# Patient Record
Sex: Female | Born: 1960
Health system: Southern US, Community
[De-identification: ages and names within clinical notes are randomized; demographics above are authoritative.]

## PROBLEM LIST (undated history)

## (undated) DIAGNOSIS — E079 Disorder of thyroid, unspecified: Secondary | ICD-10-CM

## (undated) DIAGNOSIS — E785 Hyperlipidemia, unspecified: Secondary | ICD-10-CM

## (undated) DIAGNOSIS — G43909 Migraine, unspecified, not intractable, without status migrainosus: Secondary | ICD-10-CM

## (undated) DIAGNOSIS — K219 Gastro-esophageal reflux disease without esophagitis: Secondary | ICD-10-CM

## (undated) DIAGNOSIS — T7840XA Allergy, unspecified, initial encounter: Secondary | ICD-10-CM

## (undated) HISTORY — DX: Disorder of thyroid, unspecified: E07.9

## (undated) HISTORY — DX: Hyperlipidemia, unspecified: E78.5

## (undated) HISTORY — PX: CHOLECYSTECTOMY: SHX55

## (undated) HISTORY — DX: Gastro-esophageal reflux disease without esophagitis: K21.9

## (undated) HISTORY — DX: Allergy, unspecified, initial encounter: T78.40XA

## (undated) HISTORY — DX: Migraine, unspecified, not intractable, without status migrainosus: G43.909

---

## 1993-09-21 HISTORY — PX: UTERINE FIBROID SURGERY: SHX826

## 1999-04-06 ENCOUNTER — Emergency Department (HOSPITAL_COMMUNITY): Admission: EM | Admit: 1999-04-06 | Discharge: 1999-04-06 | Payer: Self-pay | Admitting: Emergency Medicine

## 1999-04-14 ENCOUNTER — Other Ambulatory Visit: Admission: RE | Admit: 1999-04-14 | Discharge: 1999-04-14 | Payer: Self-pay | Admitting: Gynecology

## 1999-05-13 ENCOUNTER — Ambulatory Visit (HOSPITAL_COMMUNITY): Admission: RE | Admit: 1999-05-13 | Discharge: 1999-05-14 | Payer: Self-pay | Admitting: General Surgery

## 1999-05-13 ENCOUNTER — Encounter: Payer: Self-pay | Admitting: General Surgery

## 1999-12-18 ENCOUNTER — Encounter: Payer: Self-pay | Admitting: Family Medicine

## 1999-12-18 ENCOUNTER — Encounter: Admission: RE | Admit: 1999-12-18 | Discharge: 1999-12-18 | Payer: Self-pay | Admitting: Family Medicine

## 1999-12-23 ENCOUNTER — Encounter: Payer: Self-pay | Admitting: Family Medicine

## 1999-12-23 ENCOUNTER — Encounter: Admission: RE | Admit: 1999-12-23 | Discharge: 1999-12-23 | Payer: Self-pay | Admitting: Family Medicine

## 2000-06-16 ENCOUNTER — Encounter: Payer: Self-pay | Admitting: Gynecology

## 2000-06-16 ENCOUNTER — Encounter: Admission: RE | Admit: 2000-06-16 | Discharge: 2000-06-16 | Payer: Self-pay | Admitting: Gynecology

## 2000-10-12 ENCOUNTER — Other Ambulatory Visit: Admission: RE | Admit: 2000-10-12 | Discharge: 2000-10-12 | Payer: Self-pay | Admitting: Gynecology

## 2001-07-11 ENCOUNTER — Encounter: Admission: RE | Admit: 2001-07-11 | Discharge: 2001-07-11 | Payer: Self-pay | Admitting: Gynecology

## 2001-07-11 ENCOUNTER — Encounter: Payer: Self-pay | Admitting: Gynecology

## 2001-09-20 ENCOUNTER — Other Ambulatory Visit: Admission: RE | Admit: 2001-09-20 | Discharge: 2001-09-20 | Payer: Self-pay | Admitting: Gynecology

## 2001-11-02 ENCOUNTER — Other Ambulatory Visit: Admission: RE | Admit: 2001-11-02 | Discharge: 2001-11-02 | Payer: Self-pay | Admitting: Gynecology

## 2003-03-07 ENCOUNTER — Other Ambulatory Visit: Admission: RE | Admit: 2003-03-07 | Discharge: 2003-03-07 | Payer: Self-pay | Admitting: Gynecology

## 2004-05-22 ENCOUNTER — Other Ambulatory Visit: Admission: RE | Admit: 2004-05-22 | Discharge: 2004-05-22 | Payer: Self-pay | Admitting: Gynecology

## 2004-06-04 ENCOUNTER — Encounter: Admission: RE | Admit: 2004-06-04 | Discharge: 2004-06-04 | Payer: Self-pay | Admitting: Gynecology

## 2005-06-22 ENCOUNTER — Ambulatory Visit: Payer: Self-pay | Admitting: Cardiology

## 2005-06-22 ENCOUNTER — Emergency Department (HOSPITAL_COMMUNITY): Admission: EM | Admit: 2005-06-22 | Discharge: 2005-06-22 | Payer: Self-pay | Admitting: Emergency Medicine

## 2005-06-24 ENCOUNTER — Ambulatory Visit (HOSPITAL_COMMUNITY): Admission: RE | Admit: 2005-06-24 | Discharge: 2005-06-24 | Payer: Self-pay | Admitting: Cardiology

## 2005-06-26 ENCOUNTER — Encounter: Admission: RE | Admit: 2005-06-26 | Discharge: 2005-06-26 | Payer: Self-pay | Admitting: Gynecology

## 2005-07-01 ENCOUNTER — Other Ambulatory Visit: Admission: RE | Admit: 2005-07-01 | Discharge: 2005-07-01 | Payer: Self-pay | Admitting: Gynecology

## 2005-08-21 ENCOUNTER — Encounter (INDEPENDENT_AMBULATORY_CARE_PROVIDER_SITE_OTHER): Payer: Self-pay | Admitting: Specialist

## 2005-08-21 ENCOUNTER — Ambulatory Visit (HOSPITAL_COMMUNITY): Admission: RE | Admit: 2005-08-21 | Discharge: 2005-08-21 | Payer: Self-pay | Admitting: Gynecology

## 2005-08-21 ENCOUNTER — Ambulatory Visit (HOSPITAL_BASED_OUTPATIENT_CLINIC_OR_DEPARTMENT_OTHER): Admission: RE | Admit: 2005-08-21 | Discharge: 2005-08-21 | Payer: Self-pay | Admitting: Gynecology

## 2006-04-30 ENCOUNTER — Ambulatory Visit: Payer: Self-pay | Admitting: Internal Medicine

## 2006-07-13 ENCOUNTER — Encounter: Admission: RE | Admit: 2006-07-13 | Discharge: 2006-07-13 | Payer: Self-pay | Admitting: Gynecology

## 2007-06-24 ENCOUNTER — Ambulatory Visit: Payer: Self-pay | Admitting: Internal Medicine

## 2007-06-28 ENCOUNTER — Ambulatory Visit: Payer: Self-pay | Admitting: Internal Medicine

## 2007-06-28 LAB — CONVERTED CEMR LAB
ALT: 26 units/L (ref 0–35)
AST: 20 units/L (ref 0–37)

## 2007-07-26 ENCOUNTER — Encounter: Admission: RE | Admit: 2007-07-26 | Discharge: 2007-07-26 | Payer: Self-pay | Admitting: Gynecology

## 2008-11-26 ENCOUNTER — Encounter (INDEPENDENT_AMBULATORY_CARE_PROVIDER_SITE_OTHER): Payer: Self-pay | Admitting: Radiology

## 2008-11-26 ENCOUNTER — Encounter: Admission: RE | Admit: 2008-11-26 | Discharge: 2008-11-26 | Payer: Self-pay | Admitting: Gynecology

## 2008-12-03 ENCOUNTER — Encounter (INDEPENDENT_AMBULATORY_CARE_PROVIDER_SITE_OTHER): Payer: Self-pay | Admitting: *Deleted

## 2008-12-03 ENCOUNTER — Ambulatory Visit: Payer: Self-pay | Admitting: Internal Medicine

## 2009-01-09 ENCOUNTER — Ambulatory Visit: Payer: Self-pay | Admitting: Internal Medicine

## 2009-01-09 LAB — CONVERTED CEMR LAB: AST: 17 units/L (ref 0–37)

## 2009-10-15 ENCOUNTER — Encounter (INDEPENDENT_AMBULATORY_CARE_PROVIDER_SITE_OTHER): Payer: Self-pay | Admitting: *Deleted

## 2009-12-05 ENCOUNTER — Encounter: Admission: RE | Admit: 2009-12-05 | Discharge: 2009-12-05 | Payer: Self-pay | Admitting: Gynecology

## 2010-10-21 NOTE — Letter (Signed)
Summary: Appointment - Reminder 2  [employer], Main Office  [address]   [place], [place] [address]   Phone: [phone]  Fax: [phone]     October 15, 2009 MRN: [id number]   Deanna [name] [address] [place], [place]  [address]   Dear [name],  Our records indicate that it is time to schedule a follow-up appointment with Dr. Tenny Craw. It is very important that we reach you to schedule this appointment. We look forward to participating in your health care needs. Please contact us at the number listed above at your earliest convenience to schedule your appointment.  If you are unable to make an appointment at this time, give [place] a call so we can update our records.   Sincerely,    Migdalia Dk [facility] Scheduling Team

## 2010-12-05 ENCOUNTER — Other Ambulatory Visit: Payer: Self-pay | Admitting: Gynecology

## 2010-12-05 DIAGNOSIS — Z1231 Encounter for screening mammogram for malignant neoplasm of breast: Secondary | ICD-10-CM

## 2010-12-11 ENCOUNTER — Ambulatory Visit
Admission: RE | Admit: 2010-12-11 | Discharge: 2010-12-11 | Disposition: A | Discharge status: Home / Self Care | Payer: BC Managed Care – PPO | Source: Ambulatory Visit | Attending: Gynecology | Admitting: Gynecology

## 2010-12-11 DIAGNOSIS — Z1231 Encounter for screening mammogram for malignant neoplasm of breast: Secondary | ICD-10-CM

## 2010-12-16 ENCOUNTER — Other Ambulatory Visit: Payer: Self-pay | Admitting: Gynecology

## 2011-02-03 NOTE — Assessment & Plan Note (Signed)
[facility]                            CARDIOLOGY OFFICE NOTE   NAME:Carthen, ORENE ABBASI                    MRN:          [id number]  DATE:12/03/2008                            DOB:          1961/06/30    IDENTIFICATION:  Ms. Deanna [name] is a 49 year old woman.  I last saw her  actually in 2008.  She has a history of dyslipidemia.   Since seen, she has been on simvastatin therapy.  She never came back to  have her blood checked on this.   She denies chest pain.  Her breathing is okay.  She says she walks at  least 3 times per week anywhere from 15-45 minutes.   CURRENT MEDICINES:  Include  1. Synthroid 75 mcg.  2. Simvastatin 40.  3. Aspirin 81.   PHYSICAL EXAMINATION:  GENERAL:  The patient is in no distress at rest.  VITAL SIGNS:  Blood pressure is 123/79, pulse is 90, weight 214, which  is relatively unchanged.  NECK:  No bruits.  LUNGS:  Clear.  CARDIAC:  Regular rate and rhythm.  S1 and S2.  No S3 and no murmurs.  ABDOMEN:  Obese and benign.  EXTREMITIES:  Good pulses.   A 12-lead EKG shows normal sinus rhythm at 85 beats per minute.  T-wave  inversion in the inferolateral leads, that is old for   IMPRESSION:  1. Dyslipidemia.  We will set the patient up to have fasting lipids      and an AST done.  I discussed dietary, she had a book, she was      recommended and she would like to try this first.  She is not      optimistic that things will change.  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  I encouraged her to increase her physical      activity.  2. An abnormal EKG at baseline.  She has had stress test in the past      by report, this has not      changed significantly over the past for years.  I have given her a      copy of it.  I will set follow up for 1 year.     Pricilla Riffle, MD, [facility]  Electronically Signed    PVR/MedQ  DD: 12/03/2008  DT: 12/04/2008  Job #: [id number]   cc:   Gaspar Garbe, M.D.  Gretta Cool, M.D.

## 2011-02-03 NOTE — Assessment & Plan Note (Signed)
[facility]                            CARDIOLOGY OFFICE NOTE   NAME:[name], [name]                    MRN:          [id number]  DATE:06/24/2007                            DOB:          Oct 10, 1960    IDENTIFICATION:  Deanna [name] is a woman whom I have seen in the past  for dyslipidemia.  I last saw her back in August of 2007.  Since I have  seen her, she has been on Vytorin 10/20 daily.  She has not had her  lipids checked.  Her thyroid was adjusted relatively recently by Dr.  Wylene [name].   She is doing well.  Her weight has not changed.  She is eating about the  same, which actually she had a fairly good diet from a fat standpoint.  She is exercising 30-40 minutes a day on her treadmill, walking about a  mile-and-a-half to 2 miles.  Denies chest pain.  Breathing is okay.   CURRENT MEDICATIONS:  1. Synthroid 75 mcg.  2. Vytorin 10/20 daily.   PHYSICAL EXAM:  The patient is in no distress.  Blood pressure is 123/83, pulse 85 and regular, weight 216, up 4 pounds  from last year.  LUNGS:  Clear.  NECK:  No bruits.  CARDIAC:  Regular rate and rhythm.  S1, S2.  No S3.  No murmurs.  ABDOMEN:  Benign.  Obese, but no hepatomegaly.  No palpable masses.  EXTREMITIES:  2+ pulses.  No edema.   A 12-lead EKG shows normal sinus rhythm at 85 beats per minute.  Nonspecific ST changes.   IMPRESSION:  1. Dyslipidemia, significant.  Will check a fasting Lipo Med with LFTs      at her convenience, and follow up with that.  Continue on medicines      for now.  2. Synthroid.  Continue on current dose.  It has been checked, she      said, by Deanna [name].  3. Health care maintenance.  Encouraged her to stay as active as she      is.  Again, talked about calorie intake and weight.   I will set to see her tentatively in 12 months, but will be in touch  with her and adjust medicines as needed.     Pricilla Riffle, MD, [facility]  Electronically Signed    PVR/MedQ   DD: 06/24/2007  DT: 06/25/2007  Job #: [id number]   cc:   Deanna [name], M.D.

## 2011-02-06 NOTE — Consult Note (Signed)
NAME:  Deanna [name], Deanna [name] NO.:  [id number]   MEDICAL RECORD NO.:  [id number]          PATIENT TYPE:  EMS   LOCATION:  [facility]                         FACILITY:  [facility]   PHYSICIAN:  Dorian Pod, NP    DATE OF BIRTH:  December 27, 1960   DATE OF CONSULTATION:  06/22/2005  DATE OF DISCHARGE:                                   CONSULTATION   PRIMARY CARE PHYSICIAN:  Vale Haven. Andrey Campanile, M.D.   CARDIOLOGIST:  The patient is being seen by Dr. Juanito Doom today.   CHIEF COMPLAINT:  Tightness in chest.   Deanna [name] is a 50 year old Caucasian female with no known history of  coronary artery disease who saw her primary care physician today for routine  check up.  Mentioned she had been having some chest tightness for  approximately three or four weeks.  She described it as substernal  tightness, mid sternal, rated at around 5 on a scale of 1 to 10 that lasts  around five minutes, intermittently, then resolves.  The last seven days,  she also has associated left arm tightness.  Dr. Andrey Campanile sent Deanna [name]  here for further evaluation.  She denies any associated symptoms such as  nausea, vomiting, diaphoresis, dizziness or shortness of breath.  Her chest  pain is not associated with activity as she is able to walk 30 minutes on  her treadmill daily without any chest discomfort or shortness of breath.  She is currently pain-free.   ALLERGIES:  No known drug allergies.   MEDICATIONS:  1.  Synthroid 50 mcg daily.  2.  Crestor 10 mg daily.  3.  Benadryl p.r.n.   PAST MEDICAL HISTORY:  1.  Hypercholesterolemia.  2.  Hypothyroidism.  3.  Degenerative joint disease.  4.  Migraines.   SOCIAL HISTORY:  She lives in [place], [place], with her husband.  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  She has three  children.  Denies any tobacco, ETOH, drug or herbal medication use.  She  exercises on her treadmill 30 minutes daily.  She follows a regular diet.   FAMILY HISTORY:  Mother deceased at age 95 secondary to acute MI.  Father  alive and well.  She has five brothers and sisters all alive and well  without history of coronary artery disease.  Her maternal grandmother also  had associated coronary artery disease.   REVIEW OF SYSTEMS:  Other than for chest discomfort as described above,  arthralgia and pain in her back secondary to degenerative joint disease.   PHYSICAL EXAMINATION:  GENERAL APPEARANCE:  She is in no acute distress.  Very calm and cooperative.  VITAL SIGNS:  Temperature 97.3, pulse 90, respirations 20, blood pressure  127/84, sating 96% on room air.  HEENT:  Pupils are equal, round and reactive to light.  Sclerae clear.  NECK:  Supple without lymphadenopathy, no bruit or JVD.  LUNGS:  Clear to auscultation bilaterally.  CARDIOVASCULAR:  Regular rate and rhythm, S1 and S2, pulses 2+ and equal  without bruits.  ABDOMEN:  Soft and nontender, positive bowel sounds.  EXTREMITIES:  No clubbing, cyanosis, or edema.  She has 2+ DPs bilaterally.  NEUROLOGIC:  She is alert and oriented x3.  Cranial nerves II-XII grossly  intact.   Chest x-ray showed no acute disease.   EKG had a rate of 83, sinus rhythm, with nonspecific T-wave inversions.   LABORATORY DATA:  Hemoglobin 13.6, hematocrit 39.8, white blood cell count  6.8 with platelet count of 310,000.  Chemistry is pending.  Point of care  enzymes show troponin less than 0.05.  PT 12.6 with INR of 0.9.   Dr. Juanito Doom in to examine and assess patient.   A 50 year old female with atypical chest pain, unlikely cardiac in etiology.  Cardiac risk factors of weight and family history not major factor with  history of mother being smoker and greater than 45 years old.   PLAN:  Discharge patient home and her follow up with outpatient stress test  at [facility] which has been set up for June 24, 2005, at 7:45.  Patient has instructions to follow prior to  stress test.      Dorian Pod, NP     MB/MEDQ  D:  06/22/2005  T:  06/22/2005  Job:  [id number]   cc:   Vale Haven. Andrey Campanile, M.D.  Fax: [phone]   Jesse Sans. Wall, M.D.  [address]  [address]  [place]  [place] [address]

## 2011-02-06 NOTE — Op Note (Signed)
NAME:  Deanna [name], Deanna [name]             ACCOUNT NO.:  [id number]   MEDICAL RECORD NO.:  [id number]          PATIENT TYPE:  AMB   LOCATION:  [facility]                         FACILITY:  [facility]   PHYSICIAN:  Gretta Cool, M.D. DATE OF BIRTH:  1961/06/04   DATE OF PROCEDURE:  08/21/2005  DATE OF DISCHARGE:                                 OPERATIVE REPORT   PREOPERATIVE DIAGNOSIS:  Abnormal uterine bleeding with polyp by saline  infusion ultrasound.   POSTOPERATIVE DIAGNOSIS:  Abnormal uterine bleeding with polyp by saline  infusion ultrasound.   PROCEDURE:  Hysteroscopy resection of multiple endometrial polyps, total  endometrial resection for ablation plus VapoTrode.   SURGEON:  Gretta Cool, M.D.   ANESTHESIA:  IV sedation paracervical block.   DESCRIPTION OF PROCEDURE:  Under excellent anesthesia as above with the  patient prepped and draped in lithotomy position in Huey stirrups, the  cervix was grasped with a single tooth tenaculum and pulled down in view. He  was then progressively dilated with a series of Pratt dilators to  accommodate the 7 mm resectoscope. The resectoscope was then introduced and  the uterine cavity photographed. Both tubal ostia were easy to identify with  normal surrounding endometrium. On the posterior wall, there was a very  thickened polypoid endometrium. I resected multiple polyps and then totally  resected all of the endometrium 360 degrees around the endometrial cavity.  The cavity was then treated with VapoTrode so as to eliminate any  superficial islands of viable endometrium that could not be seen but  remained microscopically active. At this point the procedure was terminated  without complications. There was no significant bleeding at reduced  pressure, approximately 100 mL fluid deficit. No complications.           ______________________________  Gretta Cool, M.D.     CWL/MEDQ  D:  08/21/2005  T:  08/21/2005  Job:  [id number]   cc:    Vale Haven. Andrey Campanile, M.D.  Fax: [phone]

## 2011-02-06 NOTE — Assessment & Plan Note (Signed)
[name] [name]                              CARDIOLOGY OFFICE NOTE   NAME:[name] [name] DEGROOTE                    MRN:          [id number]  DATE:04/30/2006                            DOB:          09-16-1961    IDENTIFICATION:  Patient is a 50 year old who is referred by [name] [name] for  dyslipidemia.   HISTORY OF PRESENT ILLNESS:  The patient was recently seen in gynecology  clinic.  She has a history of high cholesterol.  Has been on a statin in the  past.  She did not tolerate Crestor because of memory loss.  Review of her  lipids in 2005 it looks like on Crestor her LDL was 43, HDL was 45.  September 2005, LDL was 64.  Patient said however she developed memory  issues, was taken off of the Crestor and has noted her memory has improved.   Most recent lipid panel done March 20, 2006 showed a total cholesterol of  287, triglycerides 194, HDL of 41, LDL of 173, __________ panel LDL particle  number was 2780.  This is significantly up from 2004 when it was 1647.   In talking to the patient, her diet overall sounds pretty good.  She will  have cereal in the morning, lunch she will have a chicken sub or chicken  salad at [employer], dinner she will have some carbs, grilled meats, occasional  barbecue.   ALLERGIES:  None.   PAST MEDICAL HISTORY:  1. Status post cholecystectomy.  2. Status post C-section.  3. History of hypothyroidism, on Synthroid.   CURRENT MEDICATIONS:  Synthroid.   FAMILY HISTORY:  Grandmother had a heart attack at age 77.  Mother died of a  heart attack at age 81.  Father, sister and brother are alive.   REVIEW OF SYSTEMS:  All systems reviewed negative to the above problem  except as noted. Patient noted no change in lipid particle number on  Lipitor.   SOCIAL HISTORY:  Patient does not smoke, drinks rarely.   PHYSICAL EXAMINATION:  GENERAL:  Patient is in no distress.  VITAL SIGNS:  Blood pressure 128/79.  Pulse is 87.  Weight  212.  LUNGS:  Clear to auscultation.  CARDIAC:  Regular rate and rhythm.  S1, S2.  No S3 or S4.  NECK:  JVP is normal.  No bruits.  ABDOMEN:  Benign.  EXTREMITIES:  Good distal pulses.  Equal onset.  No lower extremity edema.   A 12-lead EKG normal sinus rhythm, 87 beats per minute.  T-wave inversion  noted in leads V3 - V6-3.   IMPRESSION:  1. [name] [name] is a 50 year old woman who has a significant family      history of coronary artery disease and dyslipidemia.  2. She is moderately overweight and we have discussed this.  Overall, I      think diet alone and weight change even will not change her lipid      values.  I would try Vytorin in her case 10/20 and follow up lipids in      about 8 weeks' time.  Again  we will need to review her blood work      regarding recent thyroid, though I presume it was within normal limits.  3. Note the patient had a cardiac workup with a stress test done back in      October of 2006 in the hospital.  I will need to get an old EKG to      compare, this may indeed be her baseline.  4. I have talked to her about getting her children screened.  They are in      their late teens to early 75s and with this family history I think that      is appropriate.  5. I will be in touch with the patient once I have seen her blood work.                                [name] Riffle, MD, [facility]    PVR/MedQ  DD:  05/02/2006  DT:  05/03/2006  Job #:  [id number]   cc:   Deanna Cool, MD

## 2012-04-11 ENCOUNTER — Other Ambulatory Visit: Payer: Self-pay | Admitting: Gynecology

## 2012-04-11 DIAGNOSIS — Z1231 Encounter for screening mammogram for malignant neoplasm of breast: Secondary | ICD-10-CM

## 2012-04-18 ENCOUNTER — Other Ambulatory Visit: Payer: Self-pay | Admitting: Gynecology

## 2012-04-27 ENCOUNTER — Ambulatory Visit
Admission: RE | Admit: 2012-04-27 | Discharge: 2012-04-27 | Disposition: A | Discharge status: Home / Self Care | Payer: BC Managed Care – PPO | Source: Ambulatory Visit | Attending: Gynecology | Admitting: Gynecology

## 2012-04-27 DIAGNOSIS — Z1231 Encounter for screening mammogram for malignant neoplasm of breast: Secondary | ICD-10-CM

## 2013-07-31 ENCOUNTER — Other Ambulatory Visit: Payer: Self-pay | Admitting: Otolaryngology

## 2013-10-26 ENCOUNTER — Other Ambulatory Visit: Payer: Self-pay

## 2013-10-26 DIAGNOSIS — Z1231 Encounter for screening mammogram for malignant neoplasm of breast: Secondary | ICD-10-CM

## 2013-11-10 ENCOUNTER — Ambulatory Visit
Admission: RE | Admit: 2013-11-10 | Discharge: 2013-11-10 | Disposition: A | Discharge status: Home / Self Care | Payer: BC Managed Care – PPO | Source: Ambulatory Visit

## 2013-11-10 DIAGNOSIS — Z1231 Encounter for screening mammogram for malignant neoplasm of breast: Secondary | ICD-10-CM

## 2014-03-16 ENCOUNTER — Ambulatory Visit: Payer: BC Managed Care – PPO | Admitting: Physician Assistant

## 2014-03-19 ENCOUNTER — Ambulatory Visit (INDEPENDENT_AMBULATORY_CARE_PROVIDER_SITE_OTHER): Payer: BC Managed Care – PPO | Admitting: Internal Medicine

## 2014-03-19 ENCOUNTER — Encounter: Payer: Self-pay | Admitting: Internal Medicine

## 2014-03-19 VITALS — BP 132/78 | HR 73 | Ht 61.0 in | Wt 222.2 lb

## 2014-03-19 DIAGNOSIS — E039 Hypothyroidism, unspecified: Secondary | ICD-10-CM | POA: Insufficient documentation

## 2014-03-19 DIAGNOSIS — R9431 Abnormal electrocardiogram [ECG] [EKG]: Secondary | ICD-10-CM

## 2014-03-19 DIAGNOSIS — R072 Precordial pain: Secondary | ICD-10-CM

## 2014-03-19 DIAGNOSIS — E038 Other specified hypothyroidism: Secondary | ICD-10-CM

## 2014-03-19 DIAGNOSIS — R079 Chest pain, unspecified: Secondary | ICD-10-CM

## 2014-03-19 DIAGNOSIS — E785 Hyperlipidemia, unspecified: Secondary | ICD-10-CM

## 2014-03-19 NOTE — Patient Instructions (Signed)
Your physician has requested that you have en exercise stress myoview. For further information please visit [email or web address]. Please follow instruction sheet, as given.  Your physician recommends that you schedule a follow-up appointment in: after the stress test with Dr. Debara Pickett.

## 2014-03-19 NOTE — Progress Notes (Signed)
OFFICE NOTE  Chief Complaint:  Chest pain  Primary Care Physician: Deanna Pao, MD  HPI:  Deanna [name] is a 79 who was previously seen by Dr. Dorris [name] in 2010. Initially, and 2006 she had similar chest pain symptoms and an abnormal EKG which led to stress testing. This was performed by Dr. Gershon Mussel [name]. The stress test is negative for ischemia and her symptoms improved. She does have risk factors for cardiac disease including dyslipidemia, morbid obesity, and estrogen use (perimenopausal). She's recently been having some chest pain. She does report that she gets short of breath and has some left chest/arm heaviness with exertion mostly during walking and other activities which improves with rest. An EKG in the office today does demonstrate inferior and anterolateral T wave inversions. Although a prior EKG is not available to compare her, reports of prior EKGs demonstrated this may have been present in the past. There is also a strong family history of premature coronary artery disease.  PMHx:  Past Medical History  Diagnosis Date  . Hyperlipidemia   . Thyroid disease     Past Surgical History  Procedure Laterality Date  . Cesarean section  1988  . Uterine fibroid surgery  1995    FAMHx:  Family History  Problem Relation Age of Onset  . Heart attack Mother 41    massive MI    SOCHx:   reports that she has never smoked. She does not have any smokeless tobacco history on file. She reports that she drinks alcohol. She reports that she does not use illicit drugs.  ALLERGIES:  No Known Allergies  ROS: A comprehensive review of systems was negative except for: Cardiovascular: positive for chest pain  HOME MEDS: Current Outpatient Prescriptions  Medication Sig Dispense Refill  . aspirin 81 MG tablet Take 81 mg by mouth daily.      Marland Kitchen estradiol (CLIMARA - DOSED IN MG/24 HR) 0.05 mg/24hr patch Place 0.05 mg onto the skin 2 (two) times a week.      . levothyroxine  (SYNTHROID, LEVOTHROID) 75 MCG tablet Take 75 mcg by mouth daily before breakfast.      . progesterone (PROMETRIUM) 200 MG capsule Take 200 mg by mouth daily. 12 days each month      . simvastatin (ZOCOR) 40 MG tablet Take 40 mg by mouth daily.       No current facility-administered medications for this visit.    LABS/IMAGING: No results found for this or any previous visit (from the past 48 hour(s)). No results found.  VITALS: BP 132/78  Pulse 73  Ht 5\' 1"  (1.549 m)  Wt 222 lb 3.2 oz (100.789 kg)  BMI 42.01 kg/m2  EXAM: General appearance: alert, no distress and morbidly obese Neck: no carotid bruit and no JVD Lungs: clear to auscultation bilaterally Heart: regular rate and rhythm, S1, S2 normal, no murmur, click, rub or gallop Abdomen: soft, non-tender; bowel sounds normal; no masses,  no organomegaly Extremities: extremities normal, atraumatic, no cyanosis or edema Pulses: 2+ and symmetric Skin: Skin color, texture, turgor normal. No rashes or lesions Neurologic: Grossly normal Psych: Mood, affect normal  EKG: Normal sinus rhythm at 73, anterolateral T wave inversions  ASSESSMENT: 1. Chest pain 2. Abnormal EKG concerning for ischemia 3. Dyslipidemia 4. Morbid obesity 5. Hypothyroidism 6. Perimenopausal on supplemental estrogen/progesterone  PLAN: 1.   Deanna [name] has a number of cardiac risk factors and is having chest pain which sound somewhat atypical, however she does have  an abnormal EKG. This was evaluated last in 2006, therefore I would recommend a repeat stress test to further evaluate her chest pain. Plan to see her back to discuss results of that study a few weeks.  Deanna Casino, MD, [facility] Attending Cardiologist [facility]  Deanna [name],Deanna [name] 03/19/2014, 1:28 PM

## 2014-03-19 NOTE — Progress Notes (Deleted)
   Patient ID: Deanna [name], female    DOB: [birth year], 53 y.o.   MRN: [id number]  HPI    Review of Systems    Physical Exam

## 2014-03-21 ENCOUNTER — Telehealth (HOSPITAL_COMMUNITY): Payer: Self-pay

## 2014-03-21 NOTE — Telephone Encounter (Signed)
Encounter complete. 

## 2014-03-22 ENCOUNTER — Telehealth (HOSPITAL_COMMUNITY): Payer: Self-pay

## 2014-03-22 NOTE — Telephone Encounter (Signed)
Awaiting return call

## 2014-03-27 ENCOUNTER — Ambulatory Visit (HOSPITAL_COMMUNITY)
Admission: RE | Admit: 2014-03-27 | Discharge: 2014-03-27 | Disposition: A | Discharge status: Home / Self Care | Payer: BC Managed Care – PPO | Source: Ambulatory Visit | Attending: Internal Medicine | Admitting: Internal Medicine

## 2014-03-27 DIAGNOSIS — R9431 Abnormal electrocardiogram [ECG] [EKG]: Secondary | ICD-10-CM | POA: Insufficient documentation

## 2014-03-27 DIAGNOSIS — R0989 Other specified symptoms and signs involving the circulatory and respiratory systems: Secondary | ICD-10-CM | POA: Insufficient documentation

## 2014-03-27 DIAGNOSIS — R002 Palpitations: Secondary | ICD-10-CM | POA: Insufficient documentation

## 2014-03-27 DIAGNOSIS — R0602 Shortness of breath: Secondary | ICD-10-CM | POA: Insufficient documentation

## 2014-03-27 DIAGNOSIS — R079 Chest pain, unspecified: Secondary | ICD-10-CM | POA: Insufficient documentation

## 2014-03-27 DIAGNOSIS — R0609 Other forms of dyspnea: Secondary | ICD-10-CM | POA: Insufficient documentation

## 2014-03-27 DIAGNOSIS — Z8249 Family history of ischemic heart disease and other diseases of the circulatory system: Secondary | ICD-10-CM | POA: Insufficient documentation

## 2014-03-27 DIAGNOSIS — E669 Obesity, unspecified: Secondary | ICD-10-CM | POA: Insufficient documentation

## 2014-03-27 MED ORDER — TECHNETIUM TC 99M SESTAMIBI GENERIC - CARDIOLITE
30.0000 | Freq: Once | INTRAVENOUS | Status: AC | PRN
  Administered 2014-03-27: 30 via INTRAVENOUS

## 2014-03-27 MED ORDER — TECHNETIUM TC 99M SESTAMIBI GENERIC - CARDIOLITE
10.0000 | Freq: Once | INTRAVENOUS | Status: AC | PRN
  Administered 2014-03-27: 10 via INTRAVENOUS

## 2014-03-27 NOTE — Procedures (Addendum)
[facility] [address] [place] [address] [phone]  Cardiology Nuclear Med Study  Deanna [name] is a 53 y.o. female     MRN : [id number]     DOB: 09-30-1960  Procedure Date: 03/27/2014  Nuclear Med Background Indication for Stress Test:  Evaluation for Ischemia and Abnormal EKG History:  No prior cardiac or respiratory history reported. Last NUC MPI on 06/26/05-EF=61% Cardiac Risk Factors: Family History - CAD, Lipids and Obesity  Symptoms:  Chest Pain, DOE, Palpitations and SOB   Nuclear Pre-Procedure Caffeine/Decaff Intake:  7:00pm NPO After: 5:00am   IV Site: R Forearm  IV 0.9% NS with Angio Cath:  22g  Chest Size (in):  n/a IV Started by: Rolene Course, RN  Height: 5\' 1"  (1.549 m)  Cup Size: C  BMI:  Body mass index is 41.97 kg/(m^2). Weight:  222 lb (100.699 kg)   Tech Comments:  n/a    Nuclear Med Study 1 or 2 day study: 1 day  Stress Test Type:  Stress  Order Authorizing Provider:  Lyman Bishop, MD   Resting Radionuclide: Technetium 53m Sestamibi  Resting Radionuclide Dose: 10.2 mCi   Stress Radionuclide:  Technetium 38m Sestamibi  Stress Radionuclide Dose: 30.7 mCi           Stress Protocol Rest HR: 83 Stress HR: 160  Rest BP: 141/73 Stress BP: 200/72  Exercise Time (min): 7:11 METS: 8.70          Dose of Adenosine (mg):  n/a Dose of Lexiscan: n/a mg  Dose of Atropine (mg): n/a Dose of Dobutamine: n/a mcg/kg/min (at max HR)  Stress Test Technologist: Mellody Memos, CCT Nuclear Technologist: Otho Perl, CNMT   Rest Procedure:  Myocardial perfusion imaging was performed at rest 45 minutes following the intravenous administration of Technetium 24m Sestamibi. Stress Procedure:  The patient performed treadmill exercise using a Bruce  Protocol for 7 minutes 11 seconds. The patient stopped due to shortness of breath and fatigue.Patient denied any chest pain.  There were no significant ST-T wave  changes.  Technetium 85m Sestamibi was injected IV at peak exercise and myocardial perfusion imaging was performed after a brief delay.  Transient Ischemic Dilatation (Normal <1.22):  1.24 QGS EDV:  78 ml QGS ESV:  31 ml LV Ejection Fraction: 60%  Rest ECG: NSR with non-specific ST-T wave changes  Stress ECG: No significant change from baseline ECG  QPS Raw Data Images:  Mild breast attenuation.  Normal left ventricular size. Stress Images:  decreased anteroapical uptake Rest Images:  decreased anteroapical uptake Subtraction (SDS):  No evidence of ischemia.  Impression Exercise Capacity:  Good exercise capacity. BP Response:  Hypertensive blood pressure response. Clinical Symptoms:  There is dyspnea. ECG Impression:  Significant ST abnormalities consistent with ischemia. Comparison with Prior Nuclear Study: Nuclear stress test in 2006 was negative for ischemia  Overall Impression:  Low risk stress nuclear study with fixed anteroapical breast attenuation artifact. There were 2-3 mm horizontal ST depressions at peak exercise laterally, which resolved at rest.  LV Wall Motion:  NL LV Function; NL Wall Motion;EF 60%.  Pixie Casino, MD, [facility] Board Certified in Nuclear Cardiology Attending Cardiologist [place], [place]  03/27/2014 1:34 PM

## 2014-03-29 ENCOUNTER — Telehealth: Payer: Self-pay | Admitting: Internal Medicine

## 2014-03-29 NOTE — Telephone Encounter (Signed)
Patient is returning a call from Jenna. 

## 2014-03-29 NOTE — Telephone Encounter (Signed)
Pt. Informed of her stress test results and upcomimg appt

## 2014-03-29 NOTE — Progress Notes (Signed)
LMTCB regarding test results

## 2014-04-18 ENCOUNTER — Ambulatory Visit (INDEPENDENT_AMBULATORY_CARE_PROVIDER_SITE_OTHER): Payer: BC Managed Care – PPO | Admitting: Internal Medicine

## 2014-04-18 ENCOUNTER — Encounter: Payer: Self-pay | Admitting: Internal Medicine

## 2014-04-18 VITALS — BP 142/70 | HR 86 | Ht 63.0 in | Wt 218.7 lb

## 2014-04-18 DIAGNOSIS — E785 Hyperlipidemia, unspecified: Secondary | ICD-10-CM

## 2014-04-18 DIAGNOSIS — R9431 Abnormal electrocardiogram [ECG] [EKG]: Secondary | ICD-10-CM

## 2014-04-18 DIAGNOSIS — R079 Chest pain, unspecified: Secondary | ICD-10-CM

## 2014-04-18 NOTE — Progress Notes (Signed)
OFFICE NOTE  Chief Complaint:  Chest pain  Primary Care Physician: Haywood Pao, MD  HPI:  Deanna [name] is a 44 who was previously seen by Dr. Dorris Carnes in 2010. Initially, and 2006 she had similar chest pain symptoms and an abnormal EKG which led to stress testing. This was performed by Dr. Gershon Mussel wall. The stress test is negative for ischemia and her symptoms improved. She does have risk factors for cardiac disease including dyslipidemia, morbid obesity, and estrogen use (perimenopausal). She's recently been having some chest pain. She does report that she gets short of breath and has some left chest/arm heaviness with exertion mostly during walking and other activities which improves with rest. An EKG in the office today does demonstrate inferior and anterolateral T wave inversions. Although a prior EKG is not available to compare her, reports of prior EKGs demonstrated this may have been present in the past. There is also a strong family history of premature coronary artery disease.  Deanna [name] returns today for followup. She underwent a nuclear stress test which was negative for ischemia. EF was 60%. There were some lateral ST depressions however she developed no chest pain during the study. Her EKG did demonstrate T wave inversions at rest which have been present for a number of years. Overall this is interpreted as a low risk study. She does have cardiac risk factors and should continue to mitigate this risk including her strong family history of coronary artery disease. She is also on supplemental estrogen use which will increase her risk of heart disease.  Fortunately, she says since her last office visit she's had no further episodes of chest pain. She has gained 25 pounds the last 6 months and she attributes this to her thyroid being "out of whack".  PMHx:  Past Medical History  Diagnosis Date  . Hyperlipidemia   . Thyroid disease     Past Surgical History  Procedure  Laterality Date  . Cesarean section  1988  . Uterine fibroid surgery  1995    FAMHx:  Family History  Problem Relation Age of Onset  . Heart attack Mother 30    massive MI    SOCHx:   reports that she has never smoked. She does not have any smokeless tobacco history on file. She reports that she drinks alcohol. She reports that she does not use illicit drugs.  ALLERGIES:  No Known Allergies  ROS: A comprehensive review of systems was negative.  HOME MEDS: Current Outpatient Prescriptions  Medication Sig Dispense Refill  . aspirin 81 MG tablet Take 81 mg by mouth daily.      Marland Kitchen estradiol (CLIMARA - DOSED IN MG/24 HR) 0.05 mg/24hr patch Place 0.05 mg onto the skin 2 (two) times a week.      . levothyroxine (SYNTHROID, LEVOTHROID) 88 MCG tablet Take 88 mcg by mouth daily before breakfast.      . progesterone (PROMETRIUM) 200 MG capsule Take 200 mg by mouth daily. 12 days each month      . simvastatin (ZOCOR) 40 MG tablet Take 40 mg by mouth daily.       No current facility-administered medications for this visit.    LABS/IMAGING: No results found for this or any previous visit (from the past 48 hour(s)). No results found.  VITALS: BP 142/70  Pulse 86  Ht 5\' 3"  (1.6 m)  Wt 218 lb 11.2 oz (99.202 kg)  BMI 38.75 kg/m2  EXAM: deferred  EKG: deferred  ASSESSMENT: 1. Chest pain - negative nuclear stress test 2. Abnormal EKG concerning for ischemia 3. Dyslipidemia 4. Morbid obesity 5. Hypothyroidism 6. Perimenopausal on supplemental estrogen/progesterone  PLAN: 1.   Deanna [name] had a negative nuclear stress test for ischemia. She's had no further chest pain episodes. Most of her episodes are in the left upper chest and the left shoulder area as well as from her neck and may represent cervical radiculopathy and/or musculoskeletal pain. There is no predictable pattern with exercise. She does have cardiac risk factors and is aware that supplemental estrogen may  increase her risk. I've asked her to minimize that use if possible. She should continue aspirin and treatment of her cholesterol. She is to again work on weight loss once her thyroid is better regulated. I plan to see her back as needed.  Pixie Casino, MD, [facility] Attending Cardiologist [facility]  HILTY,Kenneth C 04/18/2014, 8:50 AM

## 2014-04-18 NOTE — Patient Instructions (Signed)
Your physician recommends that you schedule a follow-up appointment as needed  

## 2015-02-20 ENCOUNTER — Other Ambulatory Visit: Payer: Self-pay

## 2015-02-20 DIAGNOSIS — Z1231 Encounter for screening mammogram for malignant neoplasm of breast: Secondary | ICD-10-CM

## 2015-03-14 ENCOUNTER — Ambulatory Visit
Admission: RE | Admit: 2015-03-14 | Discharge: 2015-03-14 | Disposition: A | Discharge status: Home / Self Care | Payer: BLUE CROSS/BLUE SHIELD | Source: Ambulatory Visit

## 2015-03-14 DIAGNOSIS — Z1231 Encounter for screening mammogram for malignant neoplasm of breast: Secondary | ICD-10-CM

## 2016-01-02 DIAGNOSIS — M1712 Unilateral primary osteoarthritis, left knee: Secondary | ICD-10-CM | POA: Diagnosis not present

## 2016-01-09 DIAGNOSIS — M1712 Unilateral primary osteoarthritis, left knee: Secondary | ICD-10-CM | POA: Diagnosis not present

## 2016-01-17 DIAGNOSIS — M1712 Unilateral primary osteoarthritis, left knee: Secondary | ICD-10-CM | POA: Diagnosis not present

## 2016-01-29 DIAGNOSIS — M25562 Pain in left knee: Secondary | ICD-10-CM | POA: Diagnosis not present

## 2016-01-29 DIAGNOSIS — M25462 Effusion, left knee: Secondary | ICD-10-CM | POA: Diagnosis not present

## 2016-02-03 DIAGNOSIS — M1712 Unilateral primary osteoarthritis, left knee: Secondary | ICD-10-CM | POA: Diagnosis not present

## 2016-03-18 DIAGNOSIS — M5136 Other intervertebral disc degeneration, lumbar region: Secondary | ICD-10-CM | POA: Diagnosis not present

## 2016-03-18 DIAGNOSIS — E119 Type 2 diabetes mellitus without complications: Secondary | ICD-10-CM | POA: Diagnosis not present

## 2016-03-18 DIAGNOSIS — E784 Other hyperlipidemia: Secondary | ICD-10-CM | POA: Diagnosis not present

## 2016-03-18 DIAGNOSIS — E038 Other specified hypothyroidism: Secondary | ICD-10-CM | POA: Diagnosis not present

## 2016-03-18 DIAGNOSIS — G43909 Migraine, unspecified, not intractable, without status migrainosus: Secondary | ICD-10-CM | POA: Diagnosis not present

## 2016-03-19 DIAGNOSIS — L309 Dermatitis, unspecified: Secondary | ICD-10-CM | POA: Diagnosis not present

## 2016-06-18 ENCOUNTER — Other Ambulatory Visit: Payer: Self-pay | Admitting: Internal Medicine

## 2016-06-18 DIAGNOSIS — Z1231 Encounter for screening mammogram for malignant neoplasm of breast: Secondary | ICD-10-CM

## 2016-06-25 ENCOUNTER — Ambulatory Visit
Admission: RE | Admit: 2016-06-25 | Discharge: 2016-06-25 | Disposition: A | Discharge status: Home / Self Care | Payer: BLUE CROSS/BLUE SHIELD | Source: Ambulatory Visit | Attending: Internal Medicine | Admitting: Internal Medicine

## 2016-06-25 DIAGNOSIS — Z1231 Encounter for screening mammogram for malignant neoplasm of breast: Secondary | ICD-10-CM

## 2016-09-02 DIAGNOSIS — E119 Type 2 diabetes mellitus without complications: Secondary | ICD-10-CM | POA: Diagnosis not present

## 2016-09-02 DIAGNOSIS — E038 Other specified hypothyroidism: Secondary | ICD-10-CM | POA: Diagnosis not present

## 2016-09-02 DIAGNOSIS — Z0000 Encounter for general adult medical examination without abnormal findings: Secondary | ICD-10-CM | POA: Diagnosis not present

## 2016-09-02 DIAGNOSIS — N390 Urinary tract infection, site not specified: Secondary | ICD-10-CM | POA: Diagnosis not present

## 2016-09-03 DIAGNOSIS — Z0100 Encounter for examination of eyes and vision without abnormal findings: Secondary | ICD-10-CM | POA: Diagnosis not present

## 2016-09-09 DIAGNOSIS — Z0000 Encounter for general adult medical examination without abnormal findings: Secondary | ICD-10-CM | POA: Diagnosis not present

## 2016-09-09 DIAGNOSIS — M5136 Other intervertebral disc degeneration, lumbar region: Secondary | ICD-10-CM | POA: Diagnosis not present

## 2016-09-09 DIAGNOSIS — E119 Type 2 diabetes mellitus without complications: Secondary | ICD-10-CM | POA: Diagnosis not present

## 2016-09-09 DIAGNOSIS — G43909 Migraine, unspecified, not intractable, without status migrainosus: Secondary | ICD-10-CM | POA: Diagnosis not present

## 2016-09-09 DIAGNOSIS — Z1389 Encounter for screening for other disorder: Secondary | ICD-10-CM | POA: Diagnosis not present

## 2016-10-21 DIAGNOSIS — B372 Candidiasis of skin and nail: Secondary | ICD-10-CM | POA: Diagnosis not present

## 2016-10-21 DIAGNOSIS — N951 Menopausal and female climacteric states: Secondary | ICD-10-CM | POA: Diagnosis not present

## 2016-10-21 DIAGNOSIS — Z01419 Encounter for gynecological examination (general) (routine) without abnormal findings: Secondary | ICD-10-CM | POA: Diagnosis not present

## 2016-10-21 DIAGNOSIS — Z124 Encounter for screening for malignant neoplasm of cervix: Secondary | ICD-10-CM | POA: Diagnosis not present

## 2016-10-21 DIAGNOSIS — Z79890 Hormone replacement therapy (postmenopausal): Secondary | ICD-10-CM | POA: Diagnosis not present

## 2016-10-21 DIAGNOSIS — Z1151 Encounter for screening for human papillomavirus (HPV): Secondary | ICD-10-CM | POA: Diagnosis not present

## 2017-04-14 DIAGNOSIS — G43909 Migraine, unspecified, not intractable, without status migrainosus: Secondary | ICD-10-CM | POA: Diagnosis not present

## 2017-04-14 DIAGNOSIS — M5136 Other intervertebral disc degeneration, lumbar region: Secondary | ICD-10-CM | POA: Diagnosis not present

## 2017-04-14 DIAGNOSIS — E038 Other specified hypothyroidism: Secondary | ICD-10-CM | POA: Diagnosis not present

## 2017-04-14 DIAGNOSIS — E119 Type 2 diabetes mellitus without complications: Secondary | ICD-10-CM | POA: Diagnosis not present

## 2017-04-14 DIAGNOSIS — E784 Other hyperlipidemia: Secondary | ICD-10-CM | POA: Diagnosis not present

## 2017-06-16 DIAGNOSIS — R05 Cough: Secondary | ICD-10-CM | POA: Diagnosis not present

## 2017-06-16 DIAGNOSIS — R0981 Nasal congestion: Secondary | ICD-10-CM | POA: Diagnosis not present

## 2017-06-16 DIAGNOSIS — J019 Acute sinusitis, unspecified: Secondary | ICD-10-CM | POA: Diagnosis not present

## 2017-06-16 DIAGNOSIS — R509 Fever, unspecified: Secondary | ICD-10-CM | POA: Diagnosis not present

## 2017-09-10 DIAGNOSIS — M25552 Pain in left hip: Secondary | ICD-10-CM | POA: Diagnosis not present

## 2017-09-29 DIAGNOSIS — M1612 Unilateral primary osteoarthritis, left hip: Secondary | ICD-10-CM | POA: Diagnosis not present

## 2017-12-15 DIAGNOSIS — Z1231 Encounter for screening mammogram for malignant neoplasm of breast: Secondary | ICD-10-CM | POA: Diagnosis not present

## 2017-12-15 DIAGNOSIS — Z79890 Hormone replacement therapy (postmenopausal): Secondary | ICD-10-CM | POA: Diagnosis not present

## 2017-12-15 DIAGNOSIS — Z780 Asymptomatic menopausal state: Secondary | ICD-10-CM | POA: Diagnosis not present

## 2017-12-15 DIAGNOSIS — Z6836 Body mass index (BMI) 36.0-36.9, adult: Secondary | ICD-10-CM | POA: Diagnosis not present

## 2017-12-15 DIAGNOSIS — Z01419 Encounter for gynecological examination (general) (routine) without abnormal findings: Secondary | ICD-10-CM | POA: Diagnosis not present

## 2017-12-15 DIAGNOSIS — Z130 Encounter for screening for diseases of the blood and blood-forming organs and certain disorders involving the immune mechanism: Secondary | ICD-10-CM | POA: Diagnosis not present

## 2017-12-15 DIAGNOSIS — Z1389 Encounter for screening for other disorder: Secondary | ICD-10-CM | POA: Diagnosis not present

## 2017-12-27 DIAGNOSIS — K219 Gastro-esophageal reflux disease without esophagitis: Secondary | ICD-10-CM | POA: Diagnosis not present

## 2017-12-27 DIAGNOSIS — F458 Other somatoform disorders: Secondary | ICD-10-CM | POA: Diagnosis not present

## 2018-01-19 DIAGNOSIS — M722 Plantar fascial fibromatosis: Secondary | ICD-10-CM | POA: Diagnosis not present

## 2018-02-17 DIAGNOSIS — M722 Plantar fascial fibromatosis: Secondary | ICD-10-CM | POA: Diagnosis not present

## 2018-04-08 DIAGNOSIS — E119 Type 2 diabetes mellitus without complications: Secondary | ICD-10-CM | POA: Diagnosis not present

## 2018-04-08 DIAGNOSIS — R82998 Other abnormal findings in urine: Secondary | ICD-10-CM | POA: Diagnosis not present

## 2018-04-08 DIAGNOSIS — Z0000 Encounter for general adult medical examination without abnormal findings: Secondary | ICD-10-CM | POA: Diagnosis not present

## 2018-04-08 DIAGNOSIS — E038 Other specified hypothyroidism: Secondary | ICD-10-CM | POA: Diagnosis not present

## 2018-04-15 DIAGNOSIS — Z0000 Encounter for general adult medical examination without abnormal findings: Secondary | ICD-10-CM | POA: Diagnosis not present

## 2018-04-15 DIAGNOSIS — Z1389 Encounter for screening for other disorder: Secondary | ICD-10-CM | POA: Diagnosis not present

## 2018-04-15 DIAGNOSIS — E119 Type 2 diabetes mellitus without complications: Secondary | ICD-10-CM | POA: Diagnosis not present

## 2018-04-15 DIAGNOSIS — I10 Essential (primary) hypertension: Secondary | ICD-10-CM | POA: Diagnosis not present

## 2018-04-15 DIAGNOSIS — G43909 Migraine, unspecified, not intractable, without status migrainosus: Secondary | ICD-10-CM | POA: Diagnosis not present

## 2018-04-15 DIAGNOSIS — M5136 Other intervertebral disc degeneration, lumbar region: Secondary | ICD-10-CM | POA: Diagnosis not present

## 2018-06-20 DIAGNOSIS — E038 Other specified hypothyroidism: Secondary | ICD-10-CM | POA: Diagnosis not present

## 2018-06-20 DIAGNOSIS — I10 Essential (primary) hypertension: Secondary | ICD-10-CM | POA: Diagnosis not present

## 2018-06-20 DIAGNOSIS — R413 Other amnesia: Secondary | ICD-10-CM | POA: Diagnosis not present

## 2018-06-20 DIAGNOSIS — E7849 Other hyperlipidemia: Secondary | ICD-10-CM | POA: Diagnosis not present

## 2018-07-04 DIAGNOSIS — E7849 Other hyperlipidemia: Secondary | ICD-10-CM | POA: Diagnosis not present

## 2018-08-08 DIAGNOSIS — E119 Type 2 diabetes mellitus without complications: Secondary | ICD-10-CM | POA: Diagnosis not present

## 2018-08-25 DIAGNOSIS — E7849 Other hyperlipidemia: Secondary | ICD-10-CM | POA: Diagnosis not present

## 2018-08-25 DIAGNOSIS — E038 Other specified hypothyroidism: Secondary | ICD-10-CM | POA: Diagnosis not present

## 2018-08-25 DIAGNOSIS — E119 Type 2 diabetes mellitus without complications: Secondary | ICD-10-CM | POA: Diagnosis not present

## 2018-11-09 DIAGNOSIS — H1013 Acute atopic conjunctivitis, bilateral: Secondary | ICD-10-CM | POA: Diagnosis not present

## 2018-11-17 ENCOUNTER — Encounter: Payer: Self-pay | Admitting: Gastroenterology

## 2018-11-23 DIAGNOSIS — E119 Type 2 diabetes mellitus without complications: Secondary | ICD-10-CM | POA: Diagnosis not present

## 2018-11-23 DIAGNOSIS — E038 Other specified hypothyroidism: Secondary | ICD-10-CM | POA: Diagnosis not present

## 2018-11-23 DIAGNOSIS — E7849 Other hyperlipidemia: Secondary | ICD-10-CM | POA: Diagnosis not present

## 2018-11-23 DIAGNOSIS — I10 Essential (primary) hypertension: Secondary | ICD-10-CM | POA: Diagnosis not present

## 2019-04-18 DIAGNOSIS — E7849 Other hyperlipidemia: Secondary | ICD-10-CM | POA: Diagnosis not present

## 2019-04-18 DIAGNOSIS — R82998 Other abnormal findings in urine: Secondary | ICD-10-CM | POA: Diagnosis not present

## 2019-04-18 DIAGNOSIS — E119 Type 2 diabetes mellitus without complications: Secondary | ICD-10-CM | POA: Diagnosis not present

## 2019-04-18 DIAGNOSIS — E038 Other specified hypothyroidism: Secondary | ICD-10-CM | POA: Diagnosis not present

## 2019-04-18 DIAGNOSIS — Z0000 Encounter for general adult medical examination without abnormal findings: Secondary | ICD-10-CM | POA: Diagnosis not present

## 2019-04-25 DIAGNOSIS — Z0000 Encounter for general adult medical examination without abnormal findings: Secondary | ICD-10-CM | POA: Diagnosis not present

## 2019-04-25 DIAGNOSIS — Z79890 Hormone replacement therapy (postmenopausal): Secondary | ICD-10-CM | POA: Diagnosis not present

## 2019-04-25 DIAGNOSIS — E119 Type 2 diabetes mellitus without complications: Secondary | ICD-10-CM | POA: Diagnosis not present

## 2019-04-25 DIAGNOSIS — I10 Essential (primary) hypertension: Secondary | ICD-10-CM | POA: Diagnosis not present

## 2019-04-25 DIAGNOSIS — E785 Hyperlipidemia, unspecified: Secondary | ICD-10-CM | POA: Diagnosis not present

## 2019-06-30 DIAGNOSIS — Z79899 Other long term (current) drug therapy: Secondary | ICD-10-CM | POA: Diagnosis not present

## 2019-06-30 DIAGNOSIS — E7849 Other hyperlipidemia: Secondary | ICD-10-CM | POA: Diagnosis not present

## 2019-08-02 ENCOUNTER — Other Ambulatory Visit: Payer: Self-pay | Admitting: Internal Medicine

## 2019-08-02 ENCOUNTER — Other Ambulatory Visit: Payer: Self-pay | Admitting: Gynecology

## 2019-08-02 DIAGNOSIS — Z1231 Encounter for screening mammogram for malignant neoplasm of breast: Secondary | ICD-10-CM

## 2019-08-15 ENCOUNTER — Ambulatory Visit
Admission: RE | Admit: 2019-08-15 | Discharge: 2019-08-15 | Disposition: A | Discharge status: Home / Self Care | Payer: BC Managed Care – PPO | Source: Ambulatory Visit | Attending: Gynecology | Admitting: Gynecology

## 2019-08-15 ENCOUNTER — Other Ambulatory Visit: Payer: Self-pay

## 2019-08-15 DIAGNOSIS — Z1231 Encounter for screening mammogram for malignant neoplasm of breast: Secondary | ICD-10-CM

## 2019-09-26 ENCOUNTER — Ambulatory Visit: Payer: BLUE CROSS/BLUE SHIELD

## 2020-02-07 ENCOUNTER — Encounter: Payer: Self-pay | Admitting: Gastroenterology

## 2020-02-08 DIAGNOSIS — Z1151 Encounter for screening for human papillomavirus (HPV): Secondary | ICD-10-CM | POA: Diagnosis not present

## 2020-02-08 DIAGNOSIS — Z130 Encounter for screening for diseases of the blood and blood-forming organs and certain disorders involving the immune mechanism: Secondary | ICD-10-CM | POA: Diagnosis not present

## 2020-02-08 DIAGNOSIS — Z79890 Hormone replacement therapy (postmenopausal): Secondary | ICD-10-CM | POA: Diagnosis not present

## 2020-02-08 DIAGNOSIS — N3941 Urge incontinence: Secondary | ICD-10-CM | POA: Diagnosis not present

## 2020-02-08 DIAGNOSIS — Z124 Encounter for screening for malignant neoplasm of cervix: Secondary | ICD-10-CM | POA: Diagnosis not present

## 2020-02-08 DIAGNOSIS — Z01419 Encounter for gynecological examination (general) (routine) without abnormal findings: Secondary | ICD-10-CM | POA: Diagnosis not present

## 2020-02-08 DIAGNOSIS — Z780 Asymptomatic menopausal state: Secondary | ICD-10-CM | POA: Diagnosis not present

## 2020-02-27 ENCOUNTER — Ambulatory Visit: Payer: Self-pay

## 2020-02-27 ENCOUNTER — Other Ambulatory Visit: Payer: Self-pay

## 2020-02-27 VITALS — Ht 62.0 in | Wt 171.0 lb

## 2020-02-27 DIAGNOSIS — Z86010 Personal history of colonic polyps: Secondary | ICD-10-CM

## 2020-02-27 NOTE — Progress Notes (Signed)
Denies allergies to eggs or soy products. Denies complication of anesthesia or sedation. Denies use of weight loss medication. Denies use of O2.   Emmi instructions given for colonoscopy.   Covid testing is scheduled for Wednesday 03/13/20 @ 1:10 Pm.

## 2020-02-29 ENCOUNTER — Telehealth: Payer: Self-pay | Admitting: Gastroenterology

## 2020-02-29 DIAGNOSIS — Z86010 Personal history of colonic polyps: Secondary | ICD-10-CM

## 2020-02-29 MED ORDER — SUTAB 1479-225-188 MG PO TABS: 24.0000 | ORAL_TABLET | ORAL | 0 refills | Status: DC

## 2020-02-29 NOTE — Telephone Encounter (Signed)
Resent Sutab to [facility] as per pt request - Lelan Pons [facility]

## 2020-02-29 NOTE — Telephone Encounter (Signed)
Pt stated that pharmacy has not received prep rx.  Please send to [facility] in [place].

## 2020-03-13 ENCOUNTER — Ambulatory Visit (INDEPENDENT_AMBULATORY_CARE_PROVIDER_SITE_OTHER): Payer: BC Managed Care – PPO

## 2020-03-13 ENCOUNTER — Other Ambulatory Visit: Payer: Self-pay | Admitting: Gastroenterology

## 2020-03-13 DIAGNOSIS — Z1159 Encounter for screening for other viral diseases: Secondary | ICD-10-CM

## 2020-03-14 LAB — SARS CORONAVIRUS 2 (TAT 6-24 HRS): SARS Coronavirus 2: NEGATIVE

## 2020-03-15 ENCOUNTER — Encounter: Payer: Self-pay | Admitting: Gastroenterology

## 2020-03-15 ENCOUNTER — Other Ambulatory Visit: Payer: Self-pay

## 2020-03-15 ENCOUNTER — Ambulatory Visit (AMBULATORY_SURGERY_CENTER): Payer: BC Managed Care – PPO | Admitting: Gastroenterology

## 2020-03-15 VITALS — BP 118/66 | HR 70 | Temp 97.3°F | Resp 14 | Ht 62.0 in | Wt 171.0 lb

## 2020-03-15 DIAGNOSIS — Z8371 Family history of colonic polyps: Secondary | ICD-10-CM | POA: Diagnosis not present

## 2020-03-15 DIAGNOSIS — Z86010 Personal history of colonic polyps: Secondary | ICD-10-CM

## 2020-03-15 DIAGNOSIS — Z1211 Encounter for screening for malignant neoplasm of colon: Secondary | ICD-10-CM | POA: Diagnosis not present

## 2020-03-15 DIAGNOSIS — D124 Benign neoplasm of descending colon: Secondary | ICD-10-CM

## 2020-03-15 MED ORDER — SODIUM CHLORIDE 0.9 % IV SOLN: 500.0000 mL | Freq: Once | INTRAVENOUS | Status: DC

## 2020-03-15 NOTE — Op Note (Signed)
[facility] Patient Name: Deanna [name] Procedure Date: 03/15/2020 8:34 AM MRN: [id number] Endoscopist: Jackquline Denmark , MD Age: 59 Referring MD:  Date of Birth: Mar 28, 1961 Gender: Female Account #: [id number] Procedure:                Colonoscopy Indications:              Colon cancer screening in patient at increased                            risk: Family history of 1st-degree relative with                            colon polyps (dad) Medicines:                Monitored Anesthesia Care Procedure:                Pre-Anesthesia Assessment:                           - Prior to the procedure, a History and Physical                            was performed, and patient medications and                            allergies were reviewed. The patient's tolerance of                            previous anesthesia was also reviewed. The risks                            and benefits of the procedure and the sedation                            options and risks were discussed with the patient.                            All questions were answered, and informed consent                            was obtained. Prior Anticoagulants: The patient has                            taken no previous anticoagulant or antiplatelet                            agents. ASA Grade Assessment: II - A patient with                            mild systemic disease. After reviewing the risks                            and benefits, the patient was deemed in  satisfactory condition to undergo the procedure.                           After obtaining informed consent, the colonoscope                            was passed under direct vision. Throughout the                            procedure, the patient's blood pressure, pulse, and                            oxygen saturations were monitored continuously. The                            Colonoscope was introduced through the anus  and                            advanced to the 2 cm into the ileum. The                            colonoscopy was performed without difficulty. The                            patient tolerated the procedure well. The quality                            of the bowel preparation was good. The terminal                            ileum, ileocecal valve, appendiceal orifice, and                            rectum were photographed. Scope In: 8:39:30 AM Scope Out: 8:58:54 AM Scope Withdrawal Time: 0 hours 11 minutes 21 seconds  Total Procedure Duration: 0 hours 19 minutes 24 seconds  Findings:                 A 6 mm polyp was found in the mid descending colon.                            The polyp was sessile. The polyp was removed with a                            cold snare. Resection and retrieval were complete.                           Multiple medium-mouthed diverticula were found in                            the sigmoid colon, descending colon, transverse                            colon and ascending colon.  Non-bleeding external and internal hemorrhoids were                            found during retroflexion and during perianal exam.                            The hemorrhoids were moderate.                           The terminal ileum appeared normal.                           The exam was otherwise without abnormality on                            direct and retroflexion views. Complications:            No immediate complications. Estimated Blood Loss:     Estimated blood loss: none. Impression:               - One 6 mm polyp in the mid descending colon,                            removed with a cold snare. Resected and retrieved.                           - Pancolonic diverticulosis predominantly in the                            sigmoid colon.                           - Non-bleeding external and internal hemorrhoids.                           - Otherwise  normal colonoscopy to TI. The colon was                            highly redundant. Recommendation:           - Patient has a contact number available for                            emergencies. The signs and symptoms of potential                            delayed complications were discussed with the                            patient. Return to normal activities tomorrow.                            Written discharge instructions were provided to the                            patient.                           -  High fiber diet.                           - Continue present medications.                           - Await pathology results.                           - Repeat colonoscopy for surveillance based on                            pathology results.                           - Use Preparation H twice daily on as-needed basis                            for hemorrhoids.                           - The findings and recommendations were discussed                            with the patient's husband Deanna [name]. Jackquline Denmark, MD 03/15/2020 9:04:31 AM This report has been signed electronically.

## 2020-03-15 NOTE — Patient Instructions (Signed)
YOU HAD AN ENDOSCOPIC PROCEDURE TODAY AT THE [facility]:   Refer to the procedure report that was given to you for any specific questions about what was found during the examination.  If the procedure report does not answer your questions, please call your gastroenterologist to clarify.  If you requested that your care partner not be given the details of your procedure findings, then the procedure report has been included in a sealed envelope for you to review at your convenience later.  YOU SHOULD EXPECT: Some feelings of bloating in the abdomen. Passage of more gas than usual.  Walking can help get rid of the air that was put into your GI tract during the procedure and reduce the bloating. If you had a lower endoscopy (such as a colonoscopy or flexible sigmoidoscopy) you may notice spotting of blood in your stool or on the toilet paper. If you underwent a bowel prep for your procedure, you may not have a normal bowel movement for a few days.  Please Note:  You might notice some irritation and congestion in your nose or some drainage.  This is from the oxygen used during your procedure.  There is no need for concern and it should clear up in a day or so.  SYMPTOMS TO REPORT IMMEDIATELY:   Following lower endoscopy (colonoscopy or flexible sigmoidoscopy):  Excessive amounts of blood in the stool  Significant tenderness or worsening of abdominal pains  Swelling of the abdomen that is new, acute  Fever of 100F or higher  For urgent or emergent issues, a gastroenterologist can be reached at any hour by calling [phone]. Do not use MyChart messaging for urgent concerns.    DIET:  We do recommend a small meal at first, but then you may proceed to your regular diet.  Drink plenty of fluids but you should avoid alcoholic beverages for 24 hours.  ACTIVITY:  You should plan to take it easy for the rest of today and you should NOT DRIVE or use heavy machinery until tomorrow (because  of the sedation medicines used during the test).    FOLLOW UP: Our staff will call the number listed on your records 48-72 hours following your procedure to check on you and address any questions or concerns that you may have regarding the information given to you following your procedure. If we do not reach you, we will leave a message.  We will attempt to reach you two times.  During this call, we will ask if you have developed any symptoms of COVID 19. If you develop any symptoms (ie: fever, flu-like symptoms, shortness of breath, cough etc.) before then, please call [phone].  If you test positive for Covid 19 in the 2 weeks post procedure, please call and report this information to us.    If any biopsies were taken you will be contacted by phone or by letter within the next 1-3 weeks.  Please call us at [phone] if you have not heard about the biopsies in 3 weeks.    SIGNATURES/CONFIDENTIALITY: You and/or your care partner have signed paperwork which will be entered into your electronic medical record.  These signatures attest to the fact that that the information above on your After Visit Summary has been reviewed and is understood.  Full responsibility of the confidentiality of this discharge information lies with you and/or your care-partner. 

## 2020-03-15 NOTE — Progress Notes (Signed)
VS by  Martin Majestic

## 2020-03-15 NOTE — Progress Notes (Signed)
Called to room to assist during endoscopic procedure.  Patient ID and intended procedure confirmed with present staff. Received instructions for my participation in the procedure from the performing physician.  

## 2020-03-15 NOTE — Progress Notes (Signed)
Report to PACU, RN, vss, BBS= Clear.  

## 2020-03-19 ENCOUNTER — Telehealth: Payer: Self-pay

## 2020-03-19 NOTE — Telephone Encounter (Signed)
Left message to call if having any issues or concerns, B.Makinsey Pepitone RN. 

## 2020-03-19 NOTE — Telephone Encounter (Signed)
First attempt follow up  Call to pt, unable to leave message.

## 2020-04-02 DIAGNOSIS — H903 Sensorineural hearing loss, bilateral: Secondary | ICD-10-CM | POA: Diagnosis not present

## 2020-04-02 DIAGNOSIS — H6121 Impacted cerumen, right ear: Secondary | ICD-10-CM | POA: Diagnosis not present

## 2020-04-02 DIAGNOSIS — H9113 Presbycusis, bilateral: Secondary | ICD-10-CM | POA: Diagnosis not present

## 2020-04-07 ENCOUNTER — Encounter: Payer: Self-pay | Admitting: Gastroenterology

## 2020-04-23 DIAGNOSIS — E039 Hypothyroidism, unspecified: Secondary | ICD-10-CM | POA: Diagnosis not present

## 2020-04-23 DIAGNOSIS — E7849 Other hyperlipidemia: Secondary | ICD-10-CM | POA: Diagnosis not present

## 2020-04-23 DIAGNOSIS — E119 Type 2 diabetes mellitus without complications: Secondary | ICD-10-CM | POA: Diagnosis not present

## 2020-04-23 DIAGNOSIS — Z0000 Encounter for general adult medical examination without abnormal findings: Secondary | ICD-10-CM | POA: Diagnosis not present

## 2020-04-30 DIAGNOSIS — E119 Type 2 diabetes mellitus without complications: Secondary | ICD-10-CM | POA: Diagnosis not present

## 2020-04-30 DIAGNOSIS — Z0000 Encounter for general adult medical examination without abnormal findings: Secondary | ICD-10-CM | POA: Diagnosis not present

## 2020-04-30 DIAGNOSIS — N390 Urinary tract infection, site not specified: Secondary | ICD-10-CM | POA: Diagnosis not present

## 2020-04-30 DIAGNOSIS — R300 Dysuria: Secondary | ICD-10-CM | POA: Diagnosis not present

## 2020-04-30 DIAGNOSIS — E7800 Pure hypercholesterolemia, unspecified: Secondary | ICD-10-CM | POA: Diagnosis not present

## 2020-06-07 DIAGNOSIS — M545 Low back pain: Secondary | ICD-10-CM | POA: Diagnosis not present

## 2020-06-17 DIAGNOSIS — H2513 Age-related nuclear cataract, bilateral: Secondary | ICD-10-CM | POA: Diagnosis not present

## 2020-06-17 DIAGNOSIS — H5203 Hypermetropia, bilateral: Secondary | ICD-10-CM | POA: Diagnosis not present

## 2020-06-17 DIAGNOSIS — H52203 Unspecified astigmatism, bilateral: Secondary | ICD-10-CM | POA: Diagnosis not present

## 2020-06-25 DIAGNOSIS — M545 Low back pain, unspecified: Secondary | ICD-10-CM | POA: Diagnosis not present

## 2020-07-05 DIAGNOSIS — M545 Low back pain, unspecified: Secondary | ICD-10-CM | POA: Diagnosis not present

## 2020-09-11 DIAGNOSIS — J029 Acute pharyngitis, unspecified: Secondary | ICD-10-CM | POA: Diagnosis not present

## 2020-09-11 DIAGNOSIS — R0981 Nasal congestion: Secondary | ICD-10-CM | POA: Diagnosis not present

## 2020-09-11 DIAGNOSIS — J019 Acute sinusitis, unspecified: Secondary | ICD-10-CM | POA: Diagnosis not present

## 2020-10-31 IMAGING — MG DIGITAL SCREENING BILAT W/ TOMO W/ CAD
8 series · 8 of 24 positions shown · non-contrast
Comparison: Previous exam(s).

CLINICAL DATA: Screening.

EXAM:
DIGITAL SCREENING BILATERAL MAMMOGRAM WITH TOMO AND CAD

[L MLO synth-2D]
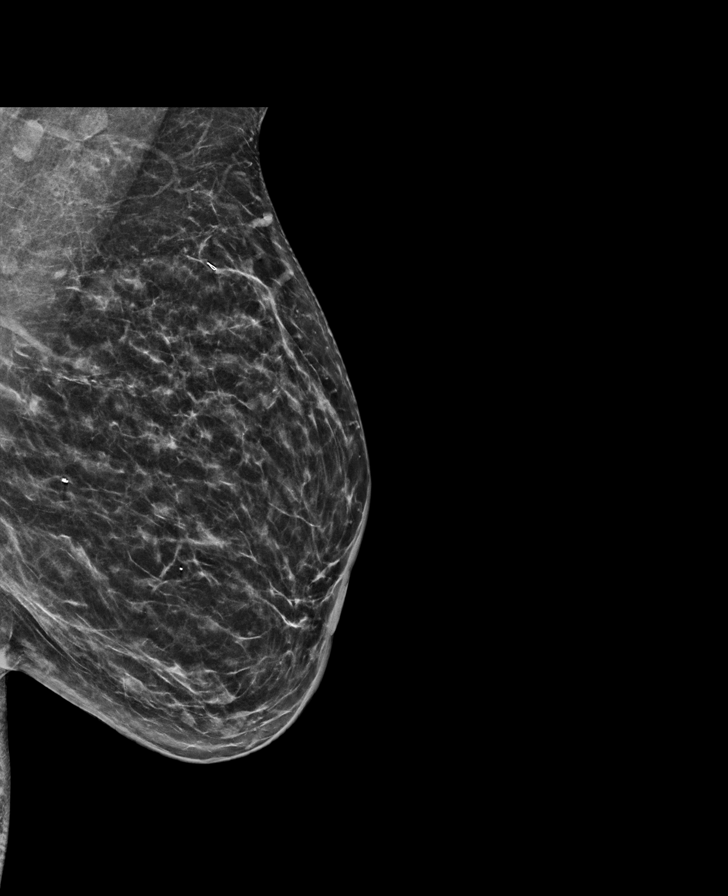

[L CC synth-2D]
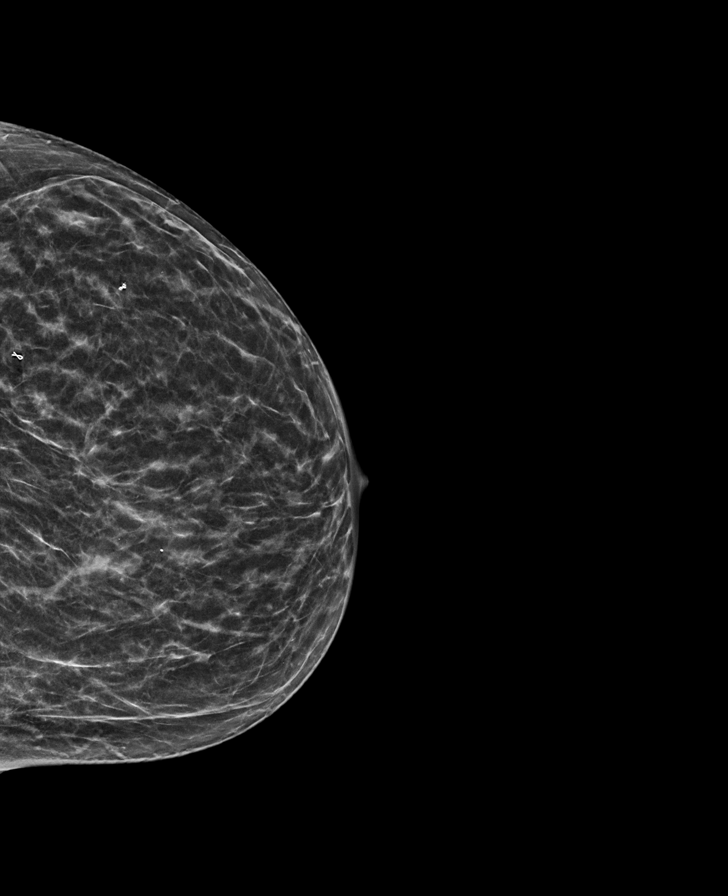

[R MLO synth-2D]
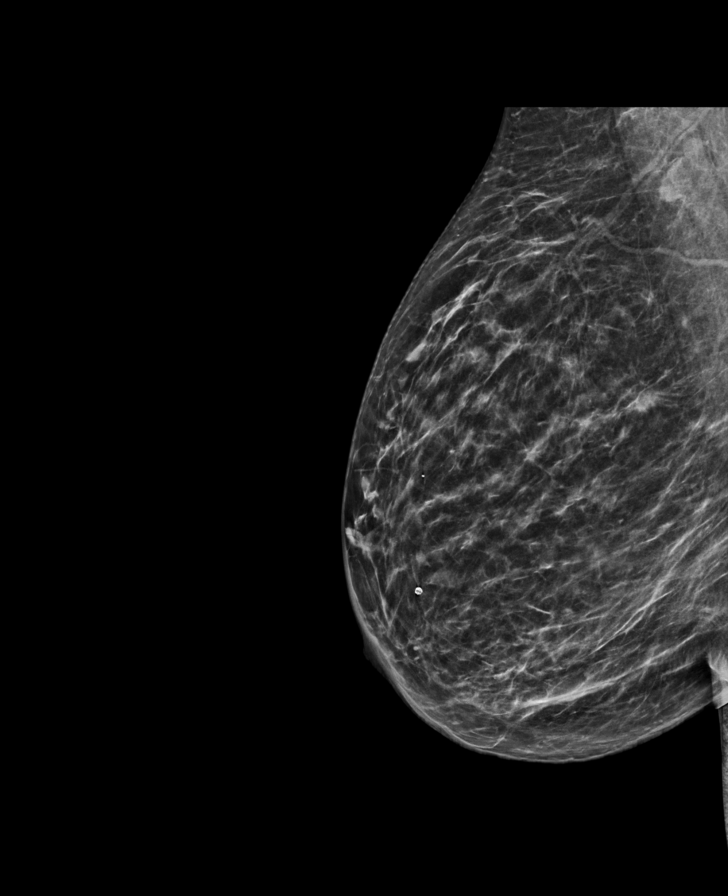

[R CC synth-2D]
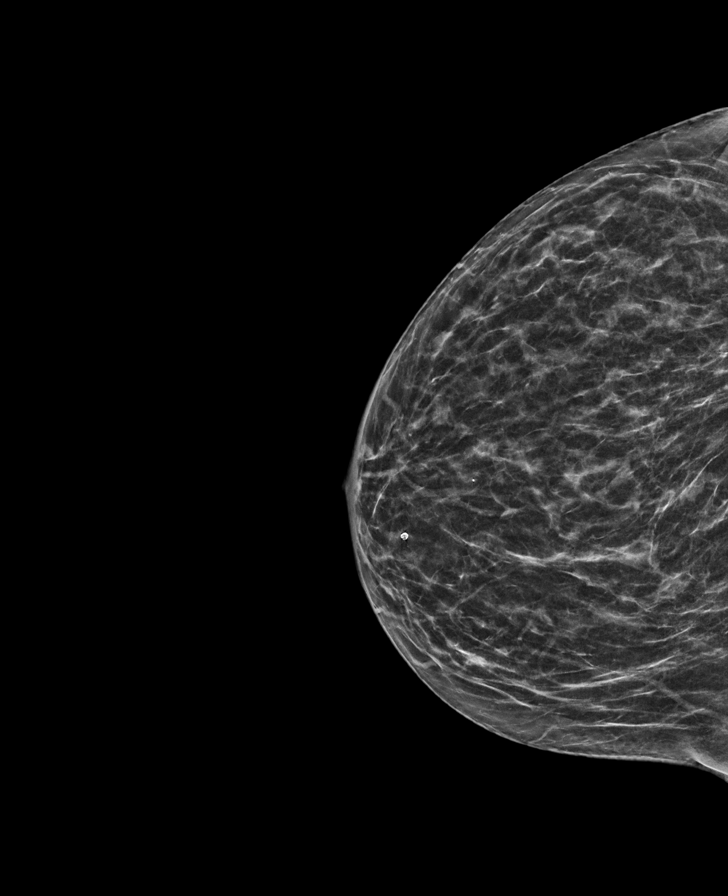

[R MLO tomo · tomo slice 29/57.0]
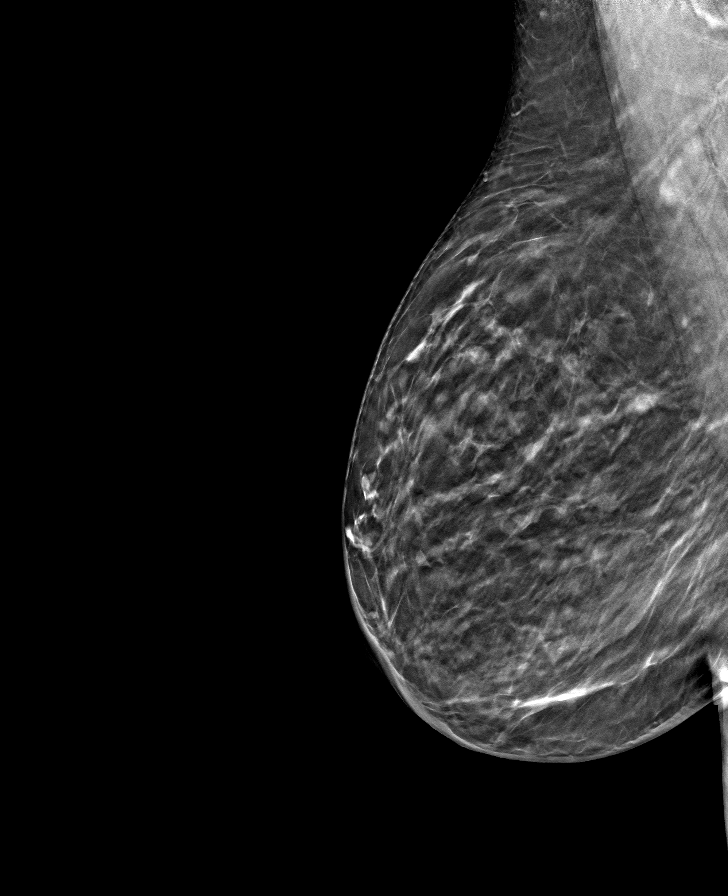

[L MLO tomo · tomo slice 29/58.0]
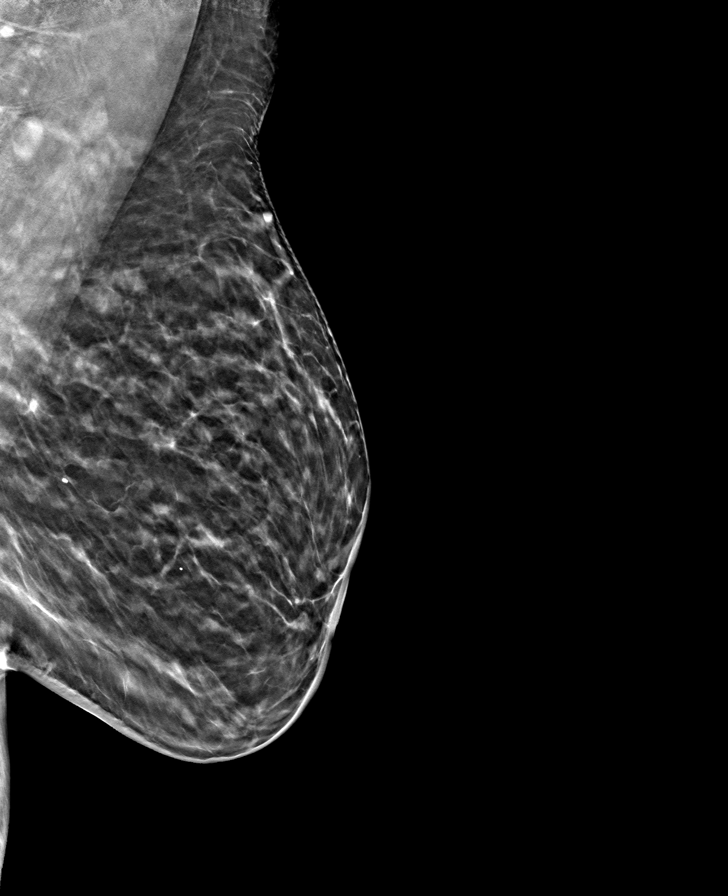

[L CC tomo · tomo slice 26/51.0]
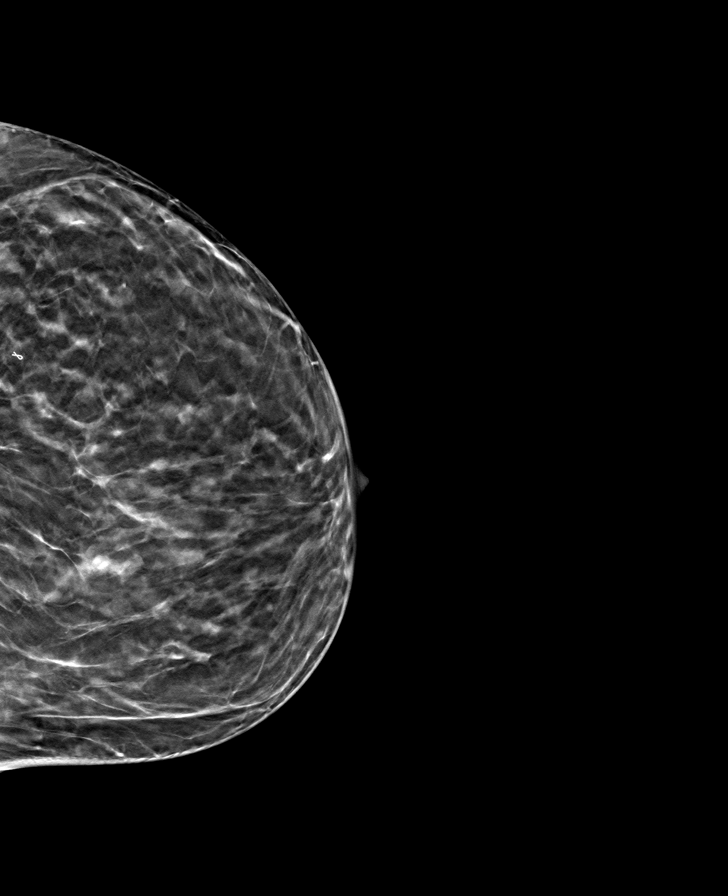

[R CC tomo · tomo slice 27/52.0]
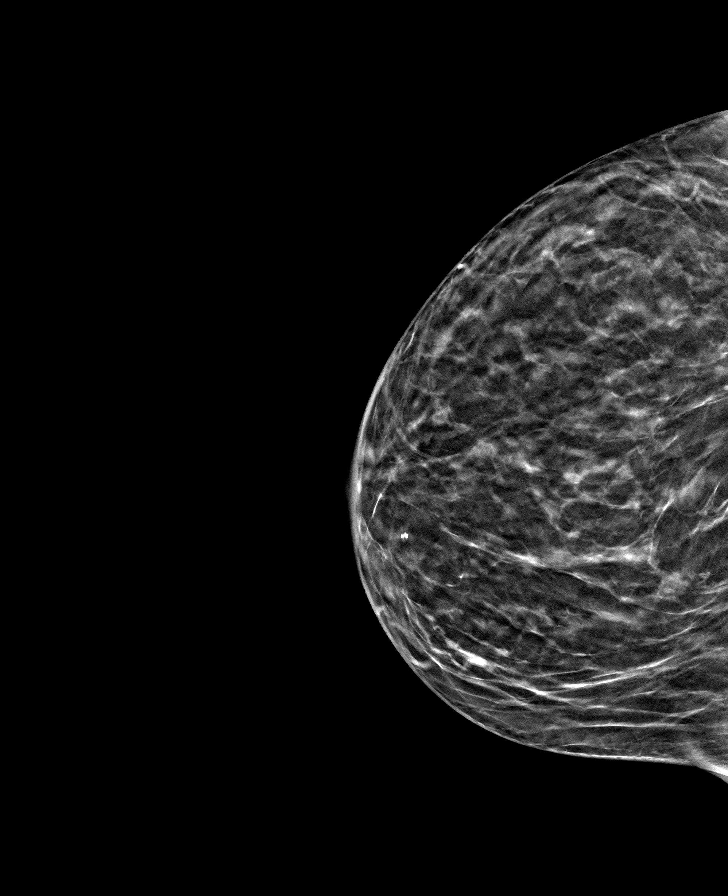

[8 of 24 positions shown; findings below may reference images not displayed]

ACR Breast Density Category b: There are scattered areas of
fibroglandular density.
FINDINGS: There are no findings suspicious for malignancy. Images were
processed with CAD.
IMPRESSION: No mammographic evidence of malignancy. A result letter of this
screening mammogram will be mailed directly to the patient.

RECOMMENDATION:
Screening mammogram in one year. (Code:[id number])

BI-RADS CATEGORY  1: Negative.

## 2020-11-29 DIAGNOSIS — I10 Essential (primary) hypertension: Secondary | ICD-10-CM | POA: Diagnosis not present

## 2020-11-29 DIAGNOSIS — J019 Acute sinusitis, unspecified: Secondary | ICD-10-CM | POA: Diagnosis not present

## 2021-01-14 ENCOUNTER — Other Ambulatory Visit: Payer: Self-pay | Admitting: Gynecology

## 2021-01-14 DIAGNOSIS — Z1231 Encounter for screening mammogram for malignant neoplasm of breast: Secondary | ICD-10-CM

## 2021-03-07 ENCOUNTER — Other Ambulatory Visit: Payer: Self-pay

## 2021-03-07 ENCOUNTER — Ambulatory Visit
Admission: RE | Admit: 2021-03-07 | Discharge: 2021-03-07 | Disposition: A | Discharge status: Home / Self Care | Payer: BC Managed Care – PPO | Source: Ambulatory Visit | Attending: Gynecology | Admitting: Gynecology

## 2021-03-07 DIAGNOSIS — Z1231 Encounter for screening mammogram for malignant neoplasm of breast: Secondary | ICD-10-CM

## 2021-03-12 DIAGNOSIS — Z130 Encounter for screening for diseases of the blood and blood-forming organs and certain disorders involving the immune mechanism: Secondary | ICD-10-CM | POA: Diagnosis not present

## 2021-03-12 DIAGNOSIS — Z79890 Hormone replacement therapy (postmenopausal): Secondary | ICD-10-CM | POA: Diagnosis not present

## 2021-03-12 DIAGNOSIS — N3941 Urge incontinence: Secondary | ICD-10-CM | POA: Diagnosis not present

## 2021-03-12 DIAGNOSIS — Z780 Asymptomatic menopausal state: Secondary | ICD-10-CM | POA: Diagnosis not present

## 2021-03-12 DIAGNOSIS — Z01419 Encounter for gynecological examination (general) (routine) without abnormal findings: Secondary | ICD-10-CM | POA: Diagnosis not present

## 2021-03-19 DIAGNOSIS — I878 Other specified disorders of veins: Secondary | ICD-10-CM | POA: Diagnosis not present

## 2021-05-06 DIAGNOSIS — R300 Dysuria: Secondary | ICD-10-CM | POA: Diagnosis not present

## 2021-05-06 DIAGNOSIS — E039 Hypothyroidism, unspecified: Secondary | ICD-10-CM | POA: Diagnosis not present

## 2021-05-06 DIAGNOSIS — E7800 Pure hypercholesterolemia, unspecified: Secondary | ICD-10-CM | POA: Diagnosis not present

## 2021-05-06 DIAGNOSIS — I10 Essential (primary) hypertension: Secondary | ICD-10-CM | POA: Diagnosis not present

## 2021-05-06 DIAGNOSIS — E119 Type 2 diabetes mellitus without complications: Secondary | ICD-10-CM | POA: Diagnosis not present

## 2021-05-16 DIAGNOSIS — Z1212 Encounter for screening for malignant neoplasm of rectum: Secondary | ICD-10-CM | POA: Diagnosis not present

## 2021-05-16 DIAGNOSIS — Z1331 Encounter for screening for depression: Secondary | ICD-10-CM | POA: Diagnosis not present

## 2021-05-16 DIAGNOSIS — Z0000 Encounter for general adult medical examination without abnormal findings: Secondary | ICD-10-CM | POA: Diagnosis not present

## 2021-05-16 DIAGNOSIS — E669 Obesity, unspecified: Secondary | ICD-10-CM | POA: Diagnosis not present

## 2021-05-16 DIAGNOSIS — E119 Type 2 diabetes mellitus without complications: Secondary | ICD-10-CM | POA: Diagnosis not present

## 2021-07-18 ENCOUNTER — Ambulatory Visit: Payer: BC Managed Care – PPO | Admitting: Cardiovascular Disease

## 2021-08-23 DIAGNOSIS — J0100 Acute maxillary sinusitis, unspecified: Secondary | ICD-10-CM | POA: Diagnosis not present

## 2021-08-29 ENCOUNTER — Ambulatory Visit: Payer: BC Managed Care – PPO | Admitting: Cardiovascular Disease

## 2021-08-29 NOTE — Progress Notes (Deleted)
Cardiology Office Note:   Date:  08/29/2021  NAME:  Deanna [name]    MRN: [id number] DOB:  September 07, 1961   PCP:  Haywood Pao, MD  Cardiologist:  None  Electrophysiologist:  None   Referring MD: Haywood Pao, MD   No chief complaint on file. ***  History of Present Illness:   Deanna [name] is a 60 y.o. female with a hx of obesity, hyperlipidemia who is being seen today for the evaluation of hyperlipidemia at the request of Tisovec, Fransico Him, MD. evaluated by cardiology in the past for atypical chest pain.  Stress testing has been normal in the past.  Elevated LDL cholesterol noted by PCP.  Referred to [place].  Problem List HLD -T chol 335, HDL 68, LDL 243, TG 134 -A1c 5.7 -TSH 3.9 2. Obesity  Past Medical History: Past Medical History:  Diagnosis Date   Allergy    GERD (gastroesophageal reflux disease)    Hyperlipidemia    Migraine    Thyroid disease     Past Surgical History: Past Surgical History:  Procedure Laterality Date   CESAREAN SECTION  1988   CHOLECYSTECTOMY     UTERINE FIBROID SURGERY  1995    Current Medications: No outpatient medications have been marked as taking for the 08/29/21 encounter (Appointment) with Geralynn Rile, MD.     Allergies:    Patient has no known allergies.   Social History: Social History   Socioeconomic History   Marital status: Married    Spouse name: Not on file   Number of children: Not on file   Years of education: Not on file   Highest education level: Not on file  Occupational History   Not on file  Tobacco Use   Smoking status: Never   Smokeless tobacco: Never  Vaping Use   Vaping Use: Never used  Substance and Sexual Activity   Alcohol use: Yes    Comment: seldom   Drug use: No   Sexual activity: Not on file  Other Topics Concern   Not on file  Social History Narrative   Not on file   Social Determinants of Health   Financial Resource Strain: Not on file  Food Insecurity: Not  on file  Transportation Needs: Not on file  Physical Activity: Not on file  Stress: Not on file  Social Connections: Not on file     Family History: The patient's ***family history includes Colon polyps in her father; Esophageal cancer in her father; Heart attack (age of onset: 77) in her mother. There is no history of Colon cancer, Stomach cancer, or Rectal cancer.  ROS:   All other ROS reviewed and negative. Pertinent positives noted in the HPI.     EKGs/Labs/Other Studies Reviewed:   The following studies were personally reviewed by me today:  EKG:  EKG is *** ordered today.  The ekg ordered today demonstrates ***, and was personally reviewed by me.   Recent Labs: No results found for requested labs within last 8760 hours.   Recent Lipid Panel No results found for: CHOL, TRIG, HDL, CHOLHDL, VLDL, LDLCALC, LDLDIRECT  Physical Exam:   VS:  LMP 11/02/2010    Wt Readings from Last 3 Encounters:  03/15/20 171 lb (77.6 kg)  02/27/20 171 lb (77.6 kg)  04/18/14 218 lb 11.2 oz (99.2 kg)    General: Well nourished, well developed, in no acute distress Head: Atraumatic, normal size  Eyes: PEERLA, EOMI  Neck: Supple, no JVD Endocrine:  No thryomegaly Cardiac: Normal S1, S2; RRR; no murmurs, rubs, or gallops Lungs: Clear to auscultation bilaterally, no wheezing, rhonchi or rales  Abd: Soft, nontender, no hepatomegaly  Ext: No edema, pulses 2+ Musculoskeletal: No deformities, BUE and BLE strength normal and equal Skin: Warm and dry, no rashes   Neuro: Alert and oriented to person, place, time, and situation, CNII-XII grossly intact, no focal deficits  Psych: Normal mood and affect   ASSESSMENT:   Deanna [name] is a 60 y.o. female who presents for the following: No diagnosis found.  PLAN:   There are no diagnoses linked to this encounter.  {Are you ordering a CV Procedure (e.g. stress test, cath, DCCV, TEE, etc)?   Press F2        :[id number]}  Disposition: No follow-ups  on file.  Medication Adjustments/Labs and Tests Ordered: Current medicines are reviewed at length with the patient today.  Concerns regarding medicines are outlined above.  No orders of the defined types were placed in this encounter.  No orders of the defined types were placed in this encounter.   There are no Patient Instructions on file for this visit.   Time Spent with Patient: I have spent a total of *** minutes with patient reviewing hospital notes, telemetry, EKGs, labs and examining the patient as well as establishing an assessment and plan that was discussed with the patient.  > 50% of time was spent in direct patient care.  Signed, Addison Naegeli. Audie Box, MD, [place]  [address] [place], [place] [address] [phone]  08/29/2021 9:00 AM

## 2021-09-03 ENCOUNTER — Encounter: Payer: Self-pay | Admitting: *Deleted

## 2021-11-13 NOTE — Progress Notes (Signed)
Cardiology Office Note:   Date:  11/14/2021  NAME:  Deanna [name]    MRN: [id number] DOB:  1961-04-25   PCP:  Haywood Pao, MD  Cardiologist:  None  Electrophysiologist:  None   Referring MD: Haywood Pao, MD   Chief Complaint  Patient presents with   Hyperlipidemia   History of Present Illness:   Deanna [name] is a 61 y.o. female with a hx of obesity, HLD who is being seen today for the evaluation of hyperlipidemia at the request of Tisovec, Fransico Him, MD. she presents for counseling on hyperlipidemia.  Most recent LDL cholesterol 243.  Cholesterols been elevated in the past.  She was on simvastatin in the past but stopped taking this.  Did well with this.  She has had reactions to Lipitor and likely Crestor.  Crestor apparently caused memory loss.  She is not a smoker.  She has never had a heart attack or stroke.  Medical history is significant for obesity.  She seems to be well controlled in terms of A1c, 5.7.  She has never had a heart attack or stroke.  Her mother did have a heart attack.  She does exercise 3 days/week.  She goes to the gym.  She does 30 minutes of aerobic activity.  No chest pain or trouble breathing.  She does follow a keto diet.  Apparently this is low carb and high fat and protein.  She wonders if this is contributing.  I suspect it could be.  Although her cholesterol is much in a range that is likely genetic.  EKG demonstrates sinus rhythm with nonspecific ST-T changes.  She does not smoke.  No alcohol or drug use is reported.  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  She denies any major symptoms in office.  She has 3 children.  She has several grandchildren.  Overall without complaints.   Problem list 1.  Hyperlipidemia - Total cholesterol 335, HDL 68, LDL 243, triglycerides 134  Past Medical History: Past Medical History:  Diagnosis Date   Allergy    GERD (gastroesophageal reflux disease)    Hyperlipidemia    Migraine     Thyroid disease     Past Surgical History: Past Surgical History:  Procedure Laterality Date   CESAREAN SECTION  1988   CHOLECYSTECTOMY     UTERINE FIBROID SURGERY  1995    Current Medications: Current Meds  Medication Sig   aspirin 81 MG tablet Take 81 mg by mouth daily.   estradiol (CLIMARA - DOSED IN MG/24 HR) 0.05 mg/24hr patch Place 0.05 mg onto the skin 2 (two) times a week.   ezetimibe (ZETIA) 10 MG tablet Take 1 tablet (10 mg total) by mouth daily.   levothyroxine (SYNTHROID, LEVOTHROID) 88 MCG tablet Take 88 mcg by mouth daily before breakfast.   Multiple Vitamin (MULTIVITAMIN) tablet Take 1 tablet by mouth daily.   OVER THE COUNTER MEDICATION Fish Oil, One capsule daily.   progesterone (PROMETRIUM) 200 MG capsule Take 200 mg by mouth daily. 12 days each month   simvastatin (ZOCOR) 20 MG tablet Take 1 tablet (20 mg total) by mouth at bedtime.   solifenacin (VESICARE) 5 MG tablet Take 5 mg by mouth daily.     Allergies:    Patient has no known allergies.   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  Tobacco Use   Smoking status: Never   Smokeless tobacco: Never  Vaping Use   Vaping Use: Never used  Substance and Sexual Activity   Alcohol use: Not Currently    Comment: seldom   Drug use: No   Sexual activity: Not on file  Other Topics Concern   Not on file  Social History Narrative   Not on file   Social Determinants of Health   Financial Resource Strain: Not on file  Food Insecurity: Not on file  Transportation Needs: Not on file  Physical Activity: Not on file  Stress: Not on file  Social Connections: Not on file     Family History: The patient's family history includes Colon polyps in her father;  Esophageal cancer in her father; Heart attack (age of onset: 45) in her mother. There is no history of Colon cancer, Stomach cancer, or Rectal cancer.  ROS:   All other ROS reviewed and negative. Pertinent positives noted in the HPI.     EKGs/Labs/Other Studies Reviewed:   The following studies were personally reviewed by me today:  EKG:  EKG is ordered today.  The ekg ordered today demonstrates normal sinus rhythm heart rate 73, nonspecific ST-T wave changes, and was personally reviewed by me.   Recent Labs: No results found for requested labs within last 8760 hours.   Recent Lipid Panel No results found for: CHOL, TRIG, HDL, CHOLHDL, VLDL, LDLCALC, LDLDIRECT  Physical Exam:   VS:  BP 130/73    Pulse 73    Ht 5' 2.5" (1.588 m)    Wt 170 lb 6.4 oz (77.3 kg)    LMP 11/02/2010    SpO2 99%    BMI 30.67 kg/m    Wt Readings from Last 3 Encounters:  11/14/21 170 lb 6.4 oz (77.3 kg)  03/15/20 171 lb (77.6 kg)  02/27/20 171 lb (77.6 kg)    General: Well nourished, well developed, in no acute distress Head: Atraumatic, normal size  Eyes: PEERLA, EOMI  Neck: Supple, no JVD Endocrine: No thryomegaly Cardiac: Normal S1, S2; RRR; no murmurs, rubs, or gallops Lungs: Clear to auscultation bilaterally, no wheezing, rhonchi or rales  Abd: Soft, nontender, no hepatomegaly  Ext: No edema, pulses 2+ Musculoskeletal: No deformities, BUE and BLE strength normal and equal Skin: Warm and dry, no rashes   Neuro: Alert and oriented to person, place, time, and situation, CNII-XII grossly intact, no focal deficits  Psych: Normal mood and affect   ASSESSMENT:   Deanna [name] is a 61 y.o. female who presents for the following: 1. Mixed hyperlipidemia   2. Nonspecific abnormal electrocardiogram (ECG) (EKG)     PLAN:   1. Mixed hyperlipidemia 2. Nonspecific abnormal electrocardiogram (ECG) (EKG) -LDL cholesterol 243.  She merits treatment.  She is not wanting to take Crestor or Lipitor as she had  side effects in the past.  Her LDL cholesterol is high likely from genetic reasons.  We discussed genetic testing.  She would like to forego this at this time.  She does need a calcium score to determine intensification of therapy.  She also needs an LPA.  Her EKG shows nonspecific changes.  I would like to get an echo.  She has no symptoms in office of angina.  We discussed that we need to get her cholesterol  at least reduce by 50%.  Likely needs to have this lower pending calcium score and LPA.  We will start simvastatin 20 mg daily as well as Zetia 10 mg daily.  This seems to be a regimen she is comfortable with.  I would like to see her back in about 4 months.  We will get a fasting lipid profile 1 week before that appointment.  We did discuss extensively proper diet and exercise.  I think her exercise level is good.  She can work on her diet a bit.  We will see her back to discuss further pending her test.  Disposition: Return in about 4 months (around 03/14/2022).  Medication Adjustments/Labs and Tests Ordered: Current medicines are reviewed at length with the patient today.  Concerns regarding medicines are outlined above.  Orders Placed This Encounter  Procedures   CT CARDIAC SCORING (SELF PAY ONLY)   Lipoprotein A (LPA)   Lipid panel   EKG 12-Lead   ECHOCARDIOGRAM COMPLETE   Meds ordered this encounter  Medications   simvastatin (ZOCOR) 20 MG tablet    Sig: Take 1 tablet (20 mg total) by mouth at bedtime.    Dispense:  90 tablet    Refill:  3   ezetimibe (ZETIA) 10 MG tablet    Sig: Take 1 tablet (10 mg total) by mouth daily.    Dispense:  90 tablet    Refill:  3    Patient Instructions  Medication Instructions:  START Simvastatin 20 mg daily  START Zetia 10 mg daily   *If you need a refill on your cardiac medications before your next appointment, please call your pharmacy*   Lab Work: Lpa today   Lipid (1 week before appointment in 4 months, no lab appointment  needed)  If you have labs (blood work) drawn today and your tests are completely normal, you will receive your results only by: [place] (if you have MyChart) OR A paper copy in the mail If you have any lab test that is abnormal or we need to change your treatment, we will call you to review the results.   Testing/Procedures: CALCIUM SCORE   Echocardiogram - Your physician has requested that you have an echocardiogram. Echocardiography is a painless test that uses sound waves to create images of your heart. It provides your doctor with information about the size and shape of your heart and how well your hearts chambers and valves are working. This procedure takes approximately one hour. There are no restrictions for this procedure. This will be performed at either our [facility] location - [address] [place] location [facility] 2nd floor.    Follow-Up: At [facility], you and your health needs are our priority.  As part of our continuing mission to provide you with exceptional heart care, we have created designated Provider Care Teams.  These Care Teams include your primary Cardiologist (physician) and Advanced Practice Providers (APPs -  Physician Assistants and Nurse Practitioners) who all work together to provide you with the care you need, when you need it.  We recommend signing up for the patient portal called "MyChart".  Sign up information is provided on this After Visit Summary.  MyChart is used to connect with patients for Virtual Visits (Telemedicine).  Patients are able to view lab/test results, encounter notes, upcoming appointments, etc.  Non-urgent messages can be sent to your provider as well.   To learn more about what you can do  with MyChart, go to [email or web address].    Your next appointment:   4 month(s)  The format for your next appointment:   In Person  Provider:   Eleonore Chiquito, MD      Signed, Addison Naegeli.  Audie Box, MD, [place]  [address] [place], [place] [address] [phone]  11/14/2021 2:23 PM

## 2021-11-14 ENCOUNTER — Ambulatory Visit (INDEPENDENT_AMBULATORY_CARE_PROVIDER_SITE_OTHER): Payer: BC Managed Care – PPO | Admitting: Cardiovascular Disease

## 2021-11-14 ENCOUNTER — Other Ambulatory Visit: Payer: Self-pay

## 2021-11-14 ENCOUNTER — Encounter: Payer: Self-pay | Admitting: Cardiovascular Disease

## 2021-11-14 VITALS — BP 130/73 | HR 73 | Ht 62.5 in | Wt 170.4 lb

## 2021-11-14 DIAGNOSIS — R9431 Abnormal electrocardiogram [ECG] [EKG]: Secondary | ICD-10-CM | POA: Diagnosis not present

## 2021-11-14 DIAGNOSIS — E782 Mixed hyperlipidemia: Secondary | ICD-10-CM | POA: Diagnosis not present

## 2021-11-14 MED ORDER — SIMVASTATIN 20 MG PO TABS: 20.0000 mg | ORAL_TABLET | Freq: Every day | ORAL | 3 refills | Status: AC

## 2021-11-14 MED ORDER — EZETIMIBE 10 MG PO TABS: 10.0000 mg | ORAL_TABLET | Freq: Every day | ORAL | 3 refills | Status: AC

## 2021-11-14 NOTE — Patient Instructions (Signed)
Medication Instructions:  START Simvastatin 20 mg daily  START Zetia 10 mg daily   *If you need a refill on your cardiac medications before your next appointment, please call your pharmacy*   Lab Work: Lpa today   Lipid (1 week before appointment in 4 months, no lab appointment needed)  If you have labs (blood work) drawn today and your tests are completely normal, you will receive your results only by: [place] (if you have MyChart) OR A paper copy in the mail If you have any lab test that is abnormal or we need to change your treatment, we will call you to review the results.   Testing/Procedures: CALCIUM SCORE   Echocardiogram - Your physician has requested that you have an echocardiogram. Echocardiography is a painless test that uses sound waves to create images of your heart. It provides your doctor with information about the size and shape of your heart and how well your hearts chambers and valves are working. This procedure takes approximately one hour. There are no restrictions for this procedure. This will be performed at either our [facility] location - [address] [place] location [facility] 2nd floor.    Follow-Up: At [facility], you and your health needs are our priority.  As part of our continuing mission to provide you with exceptional heart care, we have created designated Provider Care Teams.  These Care Teams include your primary Cardiologist (physician) and Advanced Practice Providers (APPs -  Physician Assistants and Nurse Practitioners) who all work together to provide you with the care you need, when you need it.  We recommend signing up for the patient portal called "MyChart".  Sign up information is provided on this After Visit Summary.  MyChart is used to connect with patients for Virtual Visits (Telemedicine).  Patients are able to view lab/test results, encounter notes, upcoming appointments, etc.  Non-urgent  messages can be sent to your provider as well.   To learn more about what you can do with MyChart, go to [email or web address].    Your next appointment:   4 month(s)  The format for your next appointment:   In Person  Provider:   Eleonore Chiquito, MD

## 2021-11-15 LAB — LIPOPROTEIN A (LPA): Lipoprotein (a): 26.8 nmol/L (ref <75.0)

## 2021-12-02 ENCOUNTER — Other Ambulatory Visit: Payer: Self-pay

## 2021-12-02 ENCOUNTER — Ambulatory Visit (INDEPENDENT_AMBULATORY_CARE_PROVIDER_SITE_OTHER)
Admission: RE | Admit: 2021-12-02 | Discharge: 2021-12-02 | Disposition: A | Discharge status: Home / Self Care | Payer: Self-pay | Source: Ambulatory Visit | Attending: Cardiovascular Disease | Admitting: Cardiovascular Disease

## 2021-12-02 ENCOUNTER — Ambulatory Visit (HOSPITAL_COMMUNITY): Payer: BC Managed Care – PPO | Attending: Cardiovascular Disease

## 2021-12-02 DIAGNOSIS — R9431 Abnormal electrocardiogram [ECG] [EKG]: Secondary | ICD-10-CM

## 2021-12-02 DIAGNOSIS — E782 Mixed hyperlipidemia: Secondary | ICD-10-CM

## 2021-12-02 LAB — ECHOCARDIOGRAM COMPLETE
Area-P 1/2: 3.94 cm2
S' Lateral: 2.8 cm

## 2021-12-07 ENCOUNTER — Encounter: Payer: Self-pay | Admitting: Cardiovascular Disease

## 2022-03-18 ENCOUNTER — Ambulatory Visit: Payer: BC Managed Care – PPO | Admitting: Cardiovascular Disease

## 2022-03-26 ENCOUNTER — Other Ambulatory Visit: Payer: Self-pay | Admitting: Gynecology

## 2022-03-26 DIAGNOSIS — Z1231 Encounter for screening mammogram for malignant neoplasm of breast: Secondary | ICD-10-CM

## 2022-04-10 ENCOUNTER — Ambulatory Visit
Admission: RE | Admit: 2022-04-10 | Discharge: 2022-04-10 | Disposition: A | Discharge status: Home / Self Care | Payer: BC Managed Care – PPO | Source: Ambulatory Visit | Attending: Gynecology | Admitting: Gynecology

## 2022-04-10 DIAGNOSIS — Z1231 Encounter for screening mammogram for malignant neoplasm of breast: Secondary | ICD-10-CM | POA: Diagnosis not present

## 2022-04-22 DIAGNOSIS — R3121 Asymptomatic microscopic hematuria: Secondary | ICD-10-CM | POA: Diagnosis not present

## 2022-04-22 DIAGNOSIS — Z130 Encounter for screening for diseases of the blood and blood-forming organs and certain disorders involving the immune mechanism: Secondary | ICD-10-CM | POA: Diagnosis not present

## 2022-04-22 DIAGNOSIS — Z79890 Hormone replacement therapy (postmenopausal): Secondary | ICD-10-CM | POA: Diagnosis not present

## 2022-04-22 DIAGNOSIS — Z01419 Encounter for gynecological examination (general) (routine) without abnormal findings: Secondary | ICD-10-CM | POA: Diagnosis not present

## 2022-04-22 DIAGNOSIS — Z780 Asymptomatic menopausal state: Secondary | ICD-10-CM | POA: Diagnosis not present

## 2022-04-22 DIAGNOSIS — Z6830 Body mass index (BMI) 30.0-30.9, adult: Secondary | ICD-10-CM | POA: Diagnosis not present

## 2022-04-22 DIAGNOSIS — Z1389 Encounter for screening for other disorder: Secondary | ICD-10-CM | POA: Diagnosis not present

## 2022-05-15 DIAGNOSIS — I10 Essential (primary) hypertension: Secondary | ICD-10-CM | POA: Diagnosis not present

## 2022-05-15 DIAGNOSIS — E7800 Pure hypercholesterolemia, unspecified: Secondary | ICD-10-CM | POA: Diagnosis not present

## 2022-05-15 DIAGNOSIS — E039 Hypothyroidism, unspecified: Secondary | ICD-10-CM | POA: Diagnosis not present

## 2022-05-15 DIAGNOSIS — E119 Type 2 diabetes mellitus without complications: Secondary | ICD-10-CM | POA: Diagnosis not present

## 2022-05-20 DIAGNOSIS — Z0000 Encounter for general adult medical examination without abnormal findings: Secondary | ICD-10-CM | POA: Diagnosis not present

## 2022-05-22 DIAGNOSIS — I10 Essential (primary) hypertension: Secondary | ICD-10-CM | POA: Diagnosis not present

## 2022-05-22 DIAGNOSIS — Z1339 Encounter for screening examination for other mental health and behavioral disorders: Secondary | ICD-10-CM | POA: Diagnosis not present

## 2022-05-22 DIAGNOSIS — E119 Type 2 diabetes mellitus without complications: Secondary | ICD-10-CM | POA: Diagnosis not present

## 2022-05-22 DIAGNOSIS — R82998 Other abnormal findings in urine: Secondary | ICD-10-CM | POA: Diagnosis not present

## 2022-05-22 DIAGNOSIS — Z1331 Encounter for screening for depression: Secondary | ICD-10-CM | POA: Diagnosis not present

## 2022-05-22 DIAGNOSIS — Z0000 Encounter for general adult medical examination without abnormal findings: Secondary | ICD-10-CM | POA: Diagnosis not present

## 2022-06-10 DIAGNOSIS — H2513 Age-related nuclear cataract, bilateral: Secondary | ICD-10-CM | POA: Diagnosis not present

## 2022-06-10 DIAGNOSIS — H5203 Hypermetropia, bilateral: Secondary | ICD-10-CM | POA: Diagnosis not present

## 2022-07-22 DIAGNOSIS — K432 Incisional hernia without obstruction or gangrene: Secondary | ICD-10-CM | POA: Diagnosis not present

## 2022-12-23 DIAGNOSIS — R300 Dysuria: Secondary | ICD-10-CM | POA: Diagnosis not present

## 2023-01-21 DIAGNOSIS — K432 Incisional hernia without obstruction or gangrene: Secondary | ICD-10-CM | POA: Diagnosis not present

## 2023-05-26 ENCOUNTER — Other Ambulatory Visit: Payer: Self-pay | Admitting: Gynecology

## 2023-05-26 DIAGNOSIS — Z1231 Encounter for screening mammogram for malignant neoplasm of breast: Secondary | ICD-10-CM

## 2023-06-04 DIAGNOSIS — Z1212 Encounter for screening for malignant neoplasm of rectum: Secondary | ICD-10-CM | POA: Diagnosis not present

## 2023-06-04 DIAGNOSIS — E119 Type 2 diabetes mellitus without complications: Secondary | ICD-10-CM | POA: Diagnosis not present

## 2023-06-04 DIAGNOSIS — E039 Hypothyroidism, unspecified: Secondary | ICD-10-CM | POA: Diagnosis not present

## 2023-06-04 DIAGNOSIS — I10 Essential (primary) hypertension: Secondary | ICD-10-CM | POA: Diagnosis not present

## 2023-06-16 ENCOUNTER — Ambulatory Visit: Payer: BC Managed Care – PPO

## 2023-06-16 DIAGNOSIS — H2513 Age-related nuclear cataract, bilateral: Secondary | ICD-10-CM | POA: Diagnosis not present

## 2023-06-16 DIAGNOSIS — H52203 Unspecified astigmatism, bilateral: Secondary | ICD-10-CM | POA: Diagnosis not present

## 2023-06-16 DIAGNOSIS — H31002 Unspecified chorioretinal scars, left eye: Secondary | ICD-10-CM | POA: Diagnosis not present

## 2023-06-18 DIAGNOSIS — Z1331 Encounter for screening for depression: Secondary | ICD-10-CM | POA: Diagnosis not present

## 2023-06-18 DIAGNOSIS — E119 Type 2 diabetes mellitus without complications: Secondary | ICD-10-CM | POA: Diagnosis not present

## 2023-06-18 DIAGNOSIS — Z1339 Encounter for screening examination for other mental health and behavioral disorders: Secondary | ICD-10-CM | POA: Diagnosis not present

## 2023-06-18 DIAGNOSIS — R82998 Other abnormal findings in urine: Secondary | ICD-10-CM | POA: Diagnosis not present

## 2023-06-18 DIAGNOSIS — I10 Essential (primary) hypertension: Secondary | ICD-10-CM | POA: Diagnosis not present

## 2023-06-18 DIAGNOSIS — Z0000 Encounter for general adult medical examination without abnormal findings: Secondary | ICD-10-CM | POA: Diagnosis not present

## 2023-06-24 DIAGNOSIS — Z01419 Encounter for gynecological examination (general) (routine) without abnormal findings: Secondary | ICD-10-CM | POA: Diagnosis not present

## 2023-06-24 DIAGNOSIS — Z130 Encounter for screening for diseases of the blood and blood-forming organs and certain disorders involving the immune mechanism: Secondary | ICD-10-CM | POA: Diagnosis not present

## 2023-07-01 ENCOUNTER — Ambulatory Visit
Admission: RE | Admit: 2023-07-01 | Discharge: 2023-07-01 | Disposition: A | Discharge status: Home / Self Care | Payer: BC Managed Care – PPO | Source: Ambulatory Visit | Attending: Gynecology | Admitting: Gynecology

## 2023-07-01 DIAGNOSIS — H10501 Unspecified blepharoconjunctivitis, right eye: Secondary | ICD-10-CM | POA: Diagnosis not present

## 2023-07-01 DIAGNOSIS — H01112 Allergic dermatitis of right lower eyelid: Secondary | ICD-10-CM | POA: Diagnosis not present

## 2023-07-01 DIAGNOSIS — Z1231 Encounter for screening mammogram for malignant neoplasm of breast: Secondary | ICD-10-CM | POA: Diagnosis not present

## 2023-07-01 DIAGNOSIS — H01111 Allergic dermatitis of right upper eyelid: Secondary | ICD-10-CM | POA: Diagnosis not present

## 2023-08-10 DIAGNOSIS — N390 Urinary tract infection, site not specified: Secondary | ICD-10-CM | POA: Diagnosis not present

## 2023-11-24 DIAGNOSIS — I10 Essential (primary) hypertension: Secondary | ICD-10-CM | POA: Diagnosis not present

## 2023-11-24 DIAGNOSIS — E7800 Pure hypercholesterolemia, unspecified: Secondary | ICD-10-CM | POA: Diagnosis not present

## 2023-11-24 DIAGNOSIS — E119 Type 2 diabetes mellitus without complications: Secondary | ICD-10-CM | POA: Diagnosis not present

## 2023-11-24 DIAGNOSIS — E039 Hypothyroidism, unspecified: Secondary | ICD-10-CM | POA: Diagnosis not present

## 2023-12-15 DIAGNOSIS — M1712 Unilateral primary osteoarthritis, left knee: Secondary | ICD-10-CM | POA: Diagnosis not present

## 2024-07-10 ENCOUNTER — Other Ambulatory Visit: Payer: Self-pay | Admitting: Internal Medicine

## 2024-07-10 DIAGNOSIS — Z1231 Encounter for screening mammogram for malignant neoplasm of breast: Secondary | ICD-10-CM

## 2024-07-27 DIAGNOSIS — Z79890 Hormone replacement therapy (postmenopausal): Secondary | ICD-10-CM | POA: Diagnosis not present

## 2024-07-27 DIAGNOSIS — Z780 Asymptomatic menopausal state: Secondary | ICD-10-CM | POA: Diagnosis not present

## 2024-07-27 DIAGNOSIS — Z01419 Encounter for gynecological examination (general) (routine) without abnormal findings: Secondary | ICD-10-CM | POA: Diagnosis not present

## 2024-07-27 DIAGNOSIS — N3941 Urge incontinence: Secondary | ICD-10-CM | POA: Diagnosis not present

## 2024-08-01 ENCOUNTER — Ambulatory Visit
Admission: RE | Admit: 2024-08-01 | Discharge: 2024-08-01 | Disposition: A | Discharge status: Home / Self Care | Source: Ambulatory Visit | Attending: Internal Medicine | Admitting: Internal Medicine

## 2024-08-01 DIAGNOSIS — Z1231 Encounter for screening mammogram for malignant neoplasm of breast: Secondary | ICD-10-CM | POA: Diagnosis not present
